# Patient Record
Sex: Female | Born: 1965 | Race: Black or African American | Hispanic: No | Marital: Single | Smoking: Current every day smoker
Health system: Southern US, Community
[De-identification: ages and names within clinical notes are randomized; demographics above are authoritative.]

## PROBLEM LIST (undated history)

## (undated) DIAGNOSIS — D649 Anemia, unspecified: Secondary | ICD-10-CM

## (undated) DIAGNOSIS — I10 Essential (primary) hypertension: Secondary | ICD-10-CM

## (undated) HISTORY — PX: TUBAL LIGATION: SHX77

---

## 2017-05-22 ENCOUNTER — Inpatient Hospital Stay: Admit: 2017-05-22 | Discharge: 2017-05-22 | Disposition: A | Discharge status: Home / Self Care

## 2017-05-22 ENCOUNTER — Emergency Department: Admit: 2017-05-22

## 2017-05-22 DIAGNOSIS — J40 Bronchitis, not specified as acute or chronic: Secondary | ICD-10-CM

## 2017-05-22 LAB — URINALYSIS
Bilirubin Urine: NEGATIVE
Glucose, Ur: NEGATIVE mg/dL
Ketones, Urine: NEGATIVE mg/dL
Leukocyte Esterase, Urine: NEGATIVE
Nitrite, Urine: NEGATIVE
Protein, UA: NEGATIVE mg/dL
Specific Gravity, UA: 1.025 (ref 1.005–1.030)
Urobilinogen, Urine: 0.2 E.U./dL (ref <2.0)
pH, UA: 5.5 (ref 5.0–9.0)

## 2017-05-22 LAB — RAPID INFLUENZA A/B ANTIGENS
Influenza A by PCR: NOT DETECTED
Influenza B by PCR: NOT DETECTED

## 2017-05-22 LAB — MICROSCOPIC URINALYSIS

## 2017-05-22 MED ORDER — IBUPROFEN 800 MG PO TABS
800 MG | Freq: Once | ORAL | Status: AC
Start: 2017-05-22 — End: 2017-05-22
  Administered 2017-05-22: 15:00:00 800 mg via ORAL

## 2017-05-22 MED ORDER — AZITHROMYCIN 250 MG PO TABS
250 MG | PACK | ORAL | 0 refills | Status: AC
Start: 2017-05-22 — End: 2017-06-01

## 2017-05-22 MED ORDER — GUAIFENESIN 100 MG/5ML PO LIQD
100 MG/5ML | Freq: Three times a day (TID) | ORAL | 0 refills | Status: AC | PRN
Start: 2017-05-22 — End: ?

## 2017-05-22 MED ORDER — IBUPROFEN 800 MG PO TABS
800 MG | ORAL_TABLET | Freq: Four times a day (QID) | ORAL | 0 refills | Status: AC | PRN
Start: 2017-05-22 — End: 2017-05-26

## 2017-05-22 MED ORDER — METHYLPREDNISOLONE 4 MG PO TBPK
4 MG | PACK | ORAL | 0 refills | Status: AC
Start: 2017-05-22 — End: 2017-05-28

## 2017-05-22 NOTE — ED Provider Notes (Signed)
[facility]    Department of Emergency Medicine   ED  Provider Note  Admit Date/RoomTime: 05/22/2017 11:13 AM  ED Room: 34/34  Chief Complaint:   Generalized Body Aches (for a week body aches, chills, cough)    History of Present Illness   Source of history provided by:  patient.  History/Exam Limitations: none.      Patricia Cordova is a 51 y.o. old female with a past medical history of: History reviewed. No pertinent past medical history. presents to the emergency department by private vehicle, for nasal congestion, rhinorrhea, sore throat and cough, which began several day(s) prior to arrival.  Since onset the symptoms have been persistent and mild-moderate in severity. The symptoms are associated with fatigue and generalized body aches.  There has been NO abdominal pain, appetite decrease, chest pain, diarrhea, dizziness or dysuria.She states that she is coughing yellow phlegm up     ROS   Pertinent positives and negatives are stated within HPI, all other systems reviewed and are negative.    Past Surgical History:  has a past surgical history that includes Tubal ligation.  Social History:  reports that she has been smoking Cigarettes.  She has been smoking about 0.50 packs per day. She has never used smokeless tobacco. She reports that she drinks alcohol. She reports that she uses drugs, including Marijuana.  Family History: family history is not on file.   Allergies: Pcn [penicillins] and Oxycodone    Physical Exam           ED Triage Vitals [05/22/17 1109]   BP Temp Temp Source Pulse Resp SpO2 Height Weight   (!) 190/124 98.5 F (36.9 C) Temporal 79 16 96 % 5' 5"  (1.651 m) 165 lb (74.8 kg)      Oxygen Saturation Interpretation: Normal.     Constitutional:  Alert, development consistent with age.   Ears:  External Ears: Bilateral normal.               TM's & External Canals: normal appearance.   Nose:   There is clear rhinorrhea, mucosal erythema and mucosal edema.   Sinuses: moderate Bilateral  maxillary sinus tenderness.                    mild Bilateral frontal sinus tenderness.   Mouth:  normal tongue and buccal mucosa.    Throat: mild erythema.  Airway Patent.   Neck:  Supple. There is no  preauricular, submental, parotid and anterior cervical node tenderness.   Respiratory:   Breath sounds: Bilateral normal.  Lung sounds: wheezing- scattered and intermittent.    CV:  Regular rate and rhythm, normal heart sounds, without pathological murmurs, ectopy, gallops, or rubs.   GI:  Abdomen Soft, nontender, good bowel sounds.  No firm or pulsatile mass.   Integument:  Normal turgor.  Warm, dry, without visible rash.   Neurological:  Oriented.  Motor functions intact.       Lab / Imaging Results   (All laboratory and radiology results have been personally reviewed by myself)  Labs:  Results for orders placed or performed during the hospital encounter of 05/22/17   Rapid influenza A/B antigens   Result Value Ref Range    Influenza A by PCR Not Detected Not Detected    Influenza B by PCR Not Detected Not Detected   Urinalysis   Result Value Ref Range    Color, UA Yellow Straw/Yellow    Clarity, UA Clear Clear  Glucose, Ur Negative Negative mg/dL    Bilirubin Urine Negative Negative    Ketones, Urine Negative Negative mg/dL    Specific Gravity, UA 1.025 1.005 - 1.030    Blood, Urine TRACE (A) Negative    pH, UA 5.5 5.0 - 9.0    Protein, UA Negative Negative mg/dL    Urobilinogen, Urine 0.2 <2.0 E.U./dL    Nitrite, Urine Negative Negative    Leukocyte Esterase, Urine Negative Negative   Microscopic Urinalysis   Result Value Ref Range    WBC, UA 0-1 0 - 5 /HPF    RBC, UA 0-1 0 - 2 /HPF    Epi Cells FEW /HPF    Bacteria, UA FEW (A) /HPF       Imaging:  All Radiology results interpreted by Radiologist unless otherwise noted.  XR CHEST STANDARD (2 VW)   Final Result   No airspace opacities or pleural effusion.                 ED Course / Medical Decision Making     Medications   ibuprofen (ADVIL;MOTRIN)  tablet 800 mg (800 mg Oral Given 05/22/17 1120)        Re-examination:  05/24/17       Time:1220   Patient's symptoms are improving.    Consults:   None    Procedures:   none    Medical Decision Making:    Based on low suspicion for pneumonia as per history/physical findings, imaging was done and confirms the above findings.  Upper respiratory infection may  be viral in etiology . Antibiotics are indicated at this time based on clinical presentation and physical findings. She is not hypoxic.  Patient is well appearing, non toxic and appropriate for outpatient management.   Plan of Care: Normal progression of disease discussed.  All questions answered.  Explained the rationale for symptomatic treatment rather than use of an antibiotic.  Instruction provided in the use of fluids, vaporizer, acetaminophen, and other OTC medication for symptom control.  Extra fluids  Analgesics as needed, dose reviewed.  Follow up as needed should symptoms fail to improve.     Counseling:    The emergency provider has spoken with the patient and discussed today's results, in addition to providing specific details for the plan of care and counseling regarding the diagnosis and prognosis.  Questions are answered at this time and they are agreeable with the plan.    Assessment     1. Bronchitis      Plan   Discharge to home  Patient condition is good    New Medications     Discharge Medication List as of 05/22/2017 12:24 PM      START taking these medications    Details   azithromycin (ZITHROMAX Z-PAK) 250 MG tablet TAKE 500MG PO DAY ONE... 250MG PO DAY TWO THROUGH FIVE   DISPENSE 6 TABS  NO REFILLS, Disp-1 packet, R-0Print      ibuprofen (ADVIL;MOTRIN) 800 MG tablet Take 1 tablet by mouth every 6 hours as needed for Pain, Disp-16 tablet, R-0Print      methylPREDNISolone (MEDROL, PAK,) 4 MG tablet Take by mouth., Disp-1 kit, R-0Print      guaiFENesin (ROBITUSSIN) 100 MG/5ML liquid Take 10 mLs by mouth 3 times daily as needed for Cough or  Congestion, Disp-118 mL, R-0Print           Electronically signed by Bing Matter, APRN - CNP   DD: 05/24/17  **This report  was transcribed using voice recognition software. Every effort was made to ensure accuracy; however, inadvertent computerized transcription errors may be present.  END OF ED PROVIDER NOTE          Bing Matter, APRN - CNP  05/24/17 [phone]

## 2018-08-27 ENCOUNTER — Emergency Department
Admission: EM | Admit: 2018-08-27 | Discharge: 2018-08-27 | Disposition: A | Discharge status: Home / Self Care | Payer: Self-pay | Attending: Emergency Medicine | Admitting: Emergency Medicine

## 2018-08-27 DIAGNOSIS — K529 Noninfective gastroenteritis and colitis, unspecified: Secondary | ICD-10-CM | POA: Insufficient documentation

## 2018-08-27 LAB — COMPREHENSIVE METABOLIC PANEL
ALT: 17 U/L (ref 0–44)
AST: 20 U/L (ref 15–41)
Albumin: 4.6 g/dL (ref 3.5–5.0)
Alkaline Phosphatase: 95 U/L (ref 38–126)
Anion gap: 10 (ref 5–15)
BUN: 18 mg/dL (ref 6–20)
CO2: 22 mmol/L (ref 22–32)
Calcium: 9.4 mg/dL (ref 8.9–10.3)
Chloride: 106 mmol/L (ref 98–111)
Creatinine, Ser: 0.84 mg/dL (ref 0.44–1.00)
GFR calc Af Amer: >60 mL/min (ref >60)
GFR calc non Af Amer: >60 mL/min (ref >60)
Glucose, Bld: 106 mg/dL — ABNORMAL HIGH (ref 70–99)
Potassium: 3.6 mmol/L (ref 3.5–5.1)
Sodium: 138 mmol/L (ref 135–145)
Total Bilirubin: 1.5 mg/dL — ABNORMAL HIGH (ref 0.3–1.2)
Total Protein: 7.9 g/dL (ref 6.5–8.1)

## 2018-08-27 LAB — CBC
HCT: 44.6 % (ref 36.0–46.0)
Hemoglobin: 14.9 g/dL (ref 12.0–15.0)
MCH: 28.9 pg (ref 26.0–34.0)
MCHC: 33.4 g/dL (ref 30.0–36.0)
MCV: 86.6 fL (ref 80.0–100.0)
Platelets: 269 10*3/uL (ref 150–400)
RBC: 5.15 MIL/uL — ABNORMAL HIGH (ref 3.87–5.11)
RDW: 13.9 % (ref 11.5–15.5)
WBC: 8.8 10*3/uL (ref 4.0–10.5)
nRBC: 0 % (ref 0.0–0.2)

## 2018-08-27 LAB — LIPASE, BLOOD: Lipase: 24 U/L (ref 11–51)

## 2018-08-27 MED ORDER — ONDANSETRON 4 MG PO TBDP: 4.0000 mg | ORAL_TABLET | Freq: Three times a day (TID) | ORAL | 0 refills | Status: DC | PRN

## 2018-08-27 NOTE — ED Provider Notes (Signed)
[facility] Emergency Department Provider Note   ____________________________________________    I have reviewed the triage vital signs and the nursing notes.   HISTORY  Chief Complaint Emesis and Diarrhea     HPI Hailey Myers is a 53 y.o. female who presents with complaints of nausea vomiting and diarrhea over the last 2 days.  She reports today symptoms have started to improve.  She notes that yesterday she had significant abdominal cramping but that is better today.  She has been able to tolerate broth and crackers.  Thinks she may have had a fever but is not sure.  Did have to miss work x2 days.  Believes that she may have a virus but wanted to "make sure ".   History reviewed. No pertinent past medical history.  There are no active problems to display for this patient.   Past Surgical History:  Procedure Laterality Date  . TUBAL LIGATION      Prior to Admission medications   Medication Sig Start Date End Date Taking? Authorizing Provider  ondansetron (ZOFRAN ODT) 4 MG disintegrating tablet Take 1 tablet (4 mg total) by mouth every 8 (eight) hours as needed for nausea or vomiting. 08/27/18   Jene Every, MD     Allergies Oxycodone; Paxil [paroxetine hcl]; Penicillins; and Strawberry (diagnostic)  History reviewed. No pertinent family history.  Social History Social History   Tobacco Use  . Smoking status: Never Smoker  Substance Use Topics  . Alcohol use: Not Currently  . Drug use: Not on file    Review of Systems  Constitutional: As above Eyes: No visual changes.  ENT: No sore throat. Cardiovascular: Denies chest pain. Respiratory: Denies shortness of breath.  No cough Gastrointestinal: As above Genitourinary: Negative for dysuria. Musculoskeletal: Negative for back pain.  No myalgias Skin: Negative for rash. Neurological: Negative for headaches    ____________________________________________   PHYSICAL  EXAM:  VITAL SIGNS: ED Triage Vitals [08/27/18 1130]  Enc Vitals Group     BP (!) 166/109     Pulse Rate (!) 101     Resp 18     Temp 98.6 F (37 C)     Temp Source Oral     SpO2 95 %     Weight 77.6 kg (171 lb)     Height 1.651 m (5\' 5" )     Head Circumference      Peak Flow      Pain Score 7     Pain Loc      Pain Edu?      Excl. in GC?     Constitutional: Alert and oriented. No acute distress. Pleasant and interactive Eyes: Conjunctivae are normal.  . Nose: No congestion/rhinnorhea. Mouth/Throat: Mucous membranes are moist.    Cardiovascular: Normal rate, regular rhythm. Grossly normal heart sounds.  Good peripheral circulation. Respiratory: Normal respiratory effort.  No retractions. Lungs CTAB. Gastrointestinal: Soft and nontender. No distention.  No CVA tenderness.  Reassuring exam  Musculoskeletal:  Warm and well perfused Neurologic:  Normal speech and language. No gross focal neurologic deficits are appreciated.  Skin:  Skin is warm, dry and intact. No rash noted. Psychiatric: Mood and affect are normal. Speech and behavior are normal.  ____________________________________________   LABS (all labs ordered are listed, but only abnormal results are displayed)  Labs Reviewed  COMPREHENSIVE METABOLIC PANEL - Abnormal; Notable for the following components:      Result Value   Glucose, Bld 106 (*)  Total Bilirubin 1.5 (*)    All other components within normal limits  CBC - Abnormal; Notable for the following components:   RBC 5.15 (*)    All other components within normal limits  LIPASE, BLOOD  URINALYSIS, COMPLETE (UACMP) WITH MICROSCOPIC   ____________________________________________  EKG  None ____________________________________________  RADIOLOGY   ____________________________________________   PROCEDURES  Procedure(s) performed: No  Procedures   Critical Care performed: No ____________________________________________   INITIAL  IMPRESSION / ASSESSMENT AND PLAN / ED COURSE  Pertinent labs & imaging results that were available during my care of the patient were reviewed by me and considered in my medical decision making (see chart for details).  Patient well-appearing in no acute distress.  Lab work is unremarkable.  Exam is benign.  Symptoms certainly sound consistent with viral gastroenteritis which appears to be improving.  Will treat with ODT Zofran as needed, appropriate for discharge at this time, return precautions discussed    ____________________________________________   FINAL CLINICAL IMPRESSION(S) / ED DIAGNOSES  Final diagnoses:  Gastroenteritis        Note:  This document was prepared using Dragon voice recognition software and may include unintentional dictation errors.   Jene EveryKinner, Deshane Cotroneo, MD 08/27/18 1235

## 2018-08-27 NOTE — ED Triage Notes (Signed)
Pt states "I think I have a virus" states vomiting and diarrhea x 2 days.   A&O, ambulatory. No distress noted. Mask in place.

## 2018-10-28 ENCOUNTER — Other Ambulatory Visit: Payer: Self-pay

## 2018-10-28 ENCOUNTER — Emergency Department: Payer: Self-pay

## 2018-10-28 ENCOUNTER — Emergency Department
Admission: EM | Admit: 2018-10-28 | Discharge: 2018-10-28 | Disposition: A | Discharge status: Home / Self Care | Payer: Self-pay | Attending: Emergency Medicine | Admitting: Emergency Medicine

## 2018-10-28 ENCOUNTER — Encounter: Payer: Self-pay | Admitting: Emergency Medicine

## 2018-10-28 DIAGNOSIS — I10 Essential (primary) hypertension: Secondary | ICD-10-CM | POA: Insufficient documentation

## 2018-10-28 DIAGNOSIS — Z711 Person with feared health complaint in whom no diagnosis is made: Secondary | ICD-10-CM | POA: Insufficient documentation

## 2018-10-28 DIAGNOSIS — F17200 Nicotine dependence, unspecified, uncomplicated: Secondary | ICD-10-CM | POA: Insufficient documentation

## 2018-10-28 HISTORY — DX: Anemia, unspecified: D64.9

## 2018-10-28 HISTORY — DX: Essential (primary) hypertension: I10

## 2018-10-28 LAB — CBC WITH DIFFERENTIAL/PLATELET
Abs Immature Granulocytes: 0.01 10*3/uL (ref 0.00–0.07)
BASOS ABS: 0.1 10*3/uL (ref 0.0–0.1)
Basophils Relative: 1 %
Eosinophils Absolute: 0.1 10*3/uL (ref 0.0–0.5)
Eosinophils Relative: 2 %
HCT: 43.5 % (ref 36.0–46.0)
Hemoglobin: 14.1 g/dL (ref 12.0–15.0)
IMMATURE GRANULOCYTES: 0 %
Lymphocytes Relative: 52 %
Lymphs Abs: 2.9 10*3/uL (ref 0.7–4.0)
MCH: 28.9 pg (ref 26.0–34.0)
MCHC: 32.4 g/dL (ref 30.0–36.0)
MCV: 89.1 fL (ref 80.0–100.0)
MONO ABS: 0.5 10*3/uL (ref 0.1–1.0)
Monocytes Relative: 8 %
Neutro Abs: 2.1 10*3/uL (ref 1.7–7.7)
Neutrophils Relative %: 37 %
Platelets: 203 10*3/uL (ref 150–400)
RBC: 4.88 MIL/uL (ref 3.87–5.11)
RDW: 14 % (ref 11.5–15.5)
WBC: 5.7 10*3/uL (ref 4.0–10.5)
nRBC: 0 % (ref 0.0–0.2)

## 2018-10-28 LAB — BASIC METABOLIC PANEL
ANION GAP: 7 (ref 5–15)
BUN: 20 mg/dL (ref 6–20)
CO2: 25 mmol/L (ref 22–32)
Calcium: 8.7 mg/dL — ABNORMAL LOW (ref 8.9–10.3)
Chloride: 107 mmol/L (ref 98–111)
Creatinine, Ser: 0.77 mg/dL (ref 0.44–1.00)
GFR calc Af Amer: >60 mL/min (ref >60)
GFR calc non Af Amer: >60 mL/min (ref >60)
Glucose, Bld: 108 mg/dL — ABNORMAL HIGH (ref 70–99)
Potassium: 3.7 mmol/L (ref 3.5–5.1)
Sodium: 139 mmol/L (ref 135–145)

## 2018-10-28 LAB — INFLUENZA PANEL BY PCR (TYPE A & B)
Influenza A By PCR: NEGATIVE
Influenza B By PCR: NEGATIVE

## 2018-10-28 LAB — TROPONIN I

## 2018-10-28 MED ORDER — ACETAMINOPHEN 325 MG PO TABS
650.0000 mg | ORAL_TABLET | Freq: Once | ORAL | Status: AC
  Administered 2018-10-28: 650 mg via ORAL
  Filled 2018-10-28: qty 2

## 2018-10-28 MED ORDER — AMLODIPINE BESYLATE 5 MG PO TABS
5.0000 mg | ORAL_TABLET | Freq: Once | ORAL | Status: AC
  Administered 2018-10-28: 5 mg via ORAL
  Filled 2018-10-28: qty 1

## 2018-10-28 MED ORDER — AMLODIPINE BESYLATE 5 MG PO TABS: 5.0000 mg | ORAL_TABLET | Freq: Every day | ORAL | 1 refills | Status: DC

## 2018-10-28 NOTE — Discharge Instructions (Signed)
Your blood pressure is elevated in the emergency department and you are at risk for a stroke or a heart attack.  Please follow-up with primary care for further blood pressure management.

## 2018-10-28 NOTE — ED Triage Notes (Signed)
Pt presents to ED via POV with c/o HA. Pt states is currently in menopause and has intermittent hot flashes. Pt states generalized body aches x several days, HA that started approx 1 week ago. Pt also c/o "a little nausea today". Pt states "I just have a little grandbabies at home and I just don't want to get them sick". Pt denies any recent travel. Pt states hx of HTN, states blood pressure is normally.

## 2018-10-28 NOTE — ED Provider Notes (Signed)
[facility] Emergency Department Provider Note  ____________________________________________  Time seen: Approximately 7:21 PM  I have reviewed the triage vital signs and the nursing notes.   HISTORY  Chief Complaint Headache    HPI Hailey Myers is a 53 y.o. female that presents to the emergency department for evaluation of headache and productive cough with phlegm for 1 week.  Patient states that she is worried about coronavirus and came to the emergency department because she has grandbaby is at home and does not want them to get sick.  Patient states that she had sweats several days ago at work, which is normal for her because she is going through menopause.  She had some nausea today.  She has also had some bilateral shoulder pain and right arm pain for the last week. Her stool was soft today.  Patient has a history of hypertension and was prescribed blood pressure medication in [place] previously but never followed up and self discontinued medication.  No sick contacts.  No recent travel. No contacts with Coronavirus. No SOB, vomiting, abdominal pain.   Past Medical History:  Diagnosis Date  . Anemia   . Hypertension     There are no active problems to display for this patient.   Past Surgical History:  Procedure Laterality Date  . TUBAL LIGATION      Prior to Admission medications   Medication Sig Start Date End Date Taking? Authorizing Provider  amLODipine (NORVASC) 5 MG tablet Take 1 tablet (5 mg total) by mouth daily. 10/28/18 10/28/19  Enid Derry, PA-C  ondansetron (ZOFRAN ODT) 4 MG disintegrating tablet Take 1 tablet (4 mg total) by mouth every 8 (eight) hours as needed for nausea or vomiting. 08/27/18   Jene Every, MD    Allergies Oxycodone; Paxil [paroxetine hcl]; Penicillins; and Strawberry (diagnostic)  History reviewed. No pertinent family history.  Social History Social History   Tobacco Use  . Smoking status: Current  Every Day Smoker  . Smokeless tobacco: Never Used  Substance Use Topics  . Alcohol use: Yes  . Drug use: Not on file     Review of Systems  Constitutional: No fever/chills ENT: No upper respiratory complaints. Cardiovascular: No chest pain. Respiratory: Positive for cough. No SOB. Gastrointestinal: No abdominal pain.  Positive for nausea. No vomiting.  Musculoskeletal: See HPI Skin: Negative for rash, abrasions, lacerations, ecchymosis. Neurological: Negative for numbness or tingling. Positive for frontal headache.   ____________________________________________   PHYSICAL EXAM:  VITAL SIGNS: ED Triage Vitals  Enc Vitals Group     BP 10/28/18 1730 (!) 195/123     Pulse Rate 10/28/18 1730 84     Resp 10/28/18 1730 18     Temp 10/28/18 1730 98.6 F (37 C)     Temp Source 10/28/18 1730 Oral     SpO2 10/28/18 1730 99 %     Weight 10/28/18 1731 157 lb (71.2 kg)     Height 10/28/18 1731 5\' 5"  (1.651 m)     Head Circumference --      Peak Flow --      Pain Score 10/28/18 1730 8     Pain Loc --      Pain Edu? --      Excl. in GC? --      Constitutional: Alert and oriented. Well appearing and in no acute distress. Eyes: Conjunctivae are normal. PERRL. EOMI. Head: Atraumatic. ENT:      Ears:      Nose: No congestion/rhinnorhea.  Mouth/Throat: Mucous membranes are moist.  Neck: No stridor.  Cardiovascular: Normal rate, regular rhythm.  Good peripheral circulation. Respiratory: Normal respiratory effort without tachypnea or retractions. Lungs CTAB. Good air entry to the bases with no decreased or absent breath sounds. Gastrointestinal: Bowel sounds 4 quadrants. Soft and nontender to palpation. No guarding or rigidity. No palpable masses. No distention. Musculoskeletal: Full range of motion to all extremities. No gross deformities appreciated. Neurologic:  Normal speech and language. No gross focal neurologic deficits are appreciated.  Skin:  Skin is warm, dry and  intact. No rash noted. Psychiatric: Mood and affect are normal. Speech and behavior are normal. Patient exhibits appropriate insight and judgement.   ____________________________________________   LABS (all labs ordered are listed, but only abnormal results are displayed)  Labs Reviewed  BASIC METABOLIC PANEL - Abnormal; Notable for the following components:      Result Value   Glucose, Bld 108 (*)    Calcium 8.7 (*)    All other components within normal limits  CBC WITH DIFFERENTIAL/PLATELET  TROPONIN I  INFLUENZA PANEL BY PCR (TYPE A & B)   ____________________________________________  EKG   ____________________________________________  RADIOLOGY Lexine Baton, personally viewed and evaluated these images (plain radiographs) as part of my medical decision making, as well as reviewing the written report by the radiologist.  Dg Chest 2 View  Result Date: 10/28/2018 CLINICAL DATA:  Pt presents to ED via POV with c/o HA. Pt states is currently in menopause and has intermittent hot flashes. Pt states generalized body aches x several days, HA that started approx 1 week ago. Pt also c/o "a little nausea today". Pt states "I just have a little grandbabies at home and I just don't want to get them sick". Pt denies any recent travel. Pt states hx of HTN, states blood pressure is normally. EXAM: CHEST - 2 VIEW COMPARISON:  None. FINDINGS: The cardiac silhouette is normal in size and configuration. Normal mediastinal and hilar contours. Linear opacities are noted at the lung bases consistent with scarring or subsegmental atelectasis. Lungs are otherwise clear. No pleural effusion or pneumothorax. Skeletal structures are unremarkable. IMPRESSION: No active cardiopulmonary disease. Electronically Signed   By: Amie Portland M.D.   On: 10/28/2018 19:06   Ct Head Wo Contrast  Result Date: 10/28/2018 CLINICAL DATA:  Headache EXAM: CT HEAD WITHOUT CONTRAST TECHNIQUE: Contiguous axial images were  obtained from the base of the skull through the vertex without intravenous contrast. COMPARISON:  None. FINDINGS: Brain: No acute intracranial abnormality. Specifically, no hemorrhage, hydrocephalus, mass lesion, acute infarction, or significant intracranial injury. Vascular: No hyperdense vessel or unexpected calcification. Skull: No acute calvarial abnormality. Sinuses/Orbits: Visualized paranasal sinuses and mastoids clear. Orbital soft tissues unremarkable. Old right medial orbital wall blowout fracture. Other: None IMPRESSION: No acute intracranial abnormality. Electronically Signed   By: Charlett Nose M.D.   On: 10/28/2018 19:42    ____________________________________________    PROCEDURES  Procedure(s) performed:    Procedures    Medications  acetaminophen (TYLENOL) tablet 650 mg (650 mg Oral Given 10/28/18 1943)  amLODipine (NORVASC) tablet 5 mg (5 mg Oral Given 10/28/18 2044)     ____________________________________________   INITIAL IMPRESSION / ASSESSMENT AND PLAN / ED COURSE  Pertinent labs & imaging results that were available during my care of the patient were reviewed by me and considered in my medical decision making (see chart for details).  Review of the [place] CSRS was performed in accordance of the [facility] prior to  dispensing any controlled drugs.   Patient presented to the emergency department with concerns of coronavirus.  Influenza test is negative.  Patient does not have any sick contacts with coronavirus and has not traveled out of the country recently.  She does not meet the current guidelines for coronavirus testing.  Patient's blood pressure was elevated when she arrived in the emergency department at 195/123.  Patient has a history of hypertension and states that it is usually this elevated.  She was on blood pressure medication previously but self discontinued it when she had a change of insurance.  Patient has had a headache on and off for the last week that  resolved while in the emergency department with Tylenol.  Head CT negative for acute abnormalities.  Chest x-ray is negative for acute cardiopulmonary processes.  EKG shows normal sinus rhythm.  Lab work is largely unremarkable. Troponin is <.03. Patient is reassured and feels much better knowing that she does not present with a history concerning for coronavirus.  She says that this has been bothering her all week and likely contributing to her elevated blood pressure.  She is educated extensively on her blood pressure and about her risk of heart attack or stroke.  She will be given a prescription for amlodipine and will call primary care tomorrow for further evaluation and work-up.  Patient will be discharged home with prescriptions for amlodipine. Patient is to follow up with PCP as directed. Patient is given ED precautions to return to the ED for any worsening or new symptoms.     ____________________________________________  FINAL CLINICAL IMPRESSION(S) / ED DIAGNOSES  Final diagnoses:  Hypertension, unspecified type  Feared complaint without diagnosis      NEW MEDICATIONS STARTED DURING THIS VISIT:  ED Discharge Orders         Ordered    amLODipine (NORVASC) 5 MG tablet  Daily     10/28/18 2128              This chart was dictated using voice recognition software/Dragon. Despite best efforts to proofread, errors can occur which can change the meaning. Any change was purely unintentional.    Enid Derry, PA-C 10/28/18 2345    Arnaldo Natal, MD 10/29/18 [phone]

## 2018-11-17 ENCOUNTER — Emergency Department
Admission: EM | Admit: 2018-11-17 | Discharge: 2018-11-17 | Disposition: A | Discharge status: Home / Self Care | Payer: Self-pay | Attending: Emergency Medicine | Admitting: Emergency Medicine

## 2018-11-17 ENCOUNTER — Encounter: Payer: Self-pay | Admitting: Emergency Medicine

## 2018-11-17 ENCOUNTER — Other Ambulatory Visit: Payer: Self-pay

## 2018-11-17 DIAGNOSIS — I10 Essential (primary) hypertension: Secondary | ICD-10-CM | POA: Insufficient documentation

## 2018-11-17 DIAGNOSIS — K029 Dental caries, unspecified: Secondary | ICD-10-CM | POA: Insufficient documentation

## 2018-11-17 DIAGNOSIS — K047 Periapical abscess without sinus: Secondary | ICD-10-CM | POA: Insufficient documentation

## 2018-11-17 DIAGNOSIS — F17210 Nicotine dependence, cigarettes, uncomplicated: Secondary | ICD-10-CM | POA: Insufficient documentation

## 2018-11-17 DIAGNOSIS — Z79899 Other long term (current) drug therapy: Secondary | ICD-10-CM | POA: Insufficient documentation

## 2018-11-17 MED ORDER — CLINDAMYCIN HCL 150 MG PO CAPS: ORAL_CAPSULE | ORAL | 0 refills | Status: DC

## 2018-11-17 MED ORDER — IBUPROFEN 600 MG PO TABS: 600.0000 mg | ORAL_TABLET | Freq: Three times a day (TID) | ORAL | 0 refills | Status: DC | PRN

## 2018-11-17 NOTE — Discharge Instructions (Addendum)
Take medication as prescribed.  Call 1 of the dental clinics listed on your discharge papers as they are charging on a sliding scale based on your income.  OPTIONS FOR DENTAL FOLLOW UP CARE  [place] [facility] and [facility] Services - Local Safety Net Dental Clinics [email or web address]   [facility] [phone])  [facility] [phone])  [place] [phone])  [facility] [phone])  [facility] [phone]) This clinic caters to the indigent population and is on a lottery system. Location: [employer] [facility], [employer], [address] [place] Clinic Hours: Wednesdays from 6pm - 9pm, patients seen by a lottery system. For dates, call or go to [email or web address] Services: Cleanings, fillings and simple extractions. Payment Options: DENTAL WORK IS FREE OF CHARGE. Bring proof of income or support. Best way to get seen: Arrive at 5:15 pm - this is a lottery, NOT first come/first serve, so arriving earlier will not increase your chances of being seen.     [facility] [phone] Select option 1 for emergencies   Location: [facility], [place], [address] [place] Clinic Hours: No walk-ins accepted - call the day before to schedule an appointment. Check in times are 9:30 am and 1:30 pm. Services: Simple extractions, temporary fillings, pulpectomy/pulp debridement, uncomplicated abscess drainage. Payment Options: PAYMENT IS DUE AT THE TIME OF SERVICE.  Fee is usually $100-200, additional surgical procedures (e.g. abscess drainage) may be extra. Cash, checks, Visa/MasterCard accepted.  Can file Medicaid if patient is covered for dental - patient should call case worker to check. No discount for [facility] patients. Best way to get seen: MUST call the  day before and get onto the schedule. Can usually be seen the next 1-2 days. No walk-ins accepted.     [facility] [phone]   Location: [facility], [address] [place] Clinic Hours: M, W, Th, F 8am or 1:30pm, Tues 9a or 1:30 - first come/first served. Services: Simple extractions, temporary fillings, uncomplicated abscess drainage.  You do not need to be an [facility] resident. Payment Options: PAYMENT IS DUE AT THE TIME OF SERVICE. Dental insurance, otherwise sliding scale - bring proof of income or support. Depending on income and treatment needed, cost is usually $50-200. Best way to get seen: Arrive early as it is first come/first served.     [facility] [phone]   Location: [address] Clinic Hours: Mon-Thu 8a-5p Services: Most basic dental services including extractions and fillings. Payment Options: PAYMENT IS DUE AT THE TIME OF SERVICE. Sliding scale, up to 50% off - bring proof if income or support. Medicaid with dental option accepted. Best way to get seen: Call to schedule an appointment, can usually be seen within 2 weeks OR they will try to see walk-ins - show up at 8a or 2p (you may have to wait).     [facility] [phone] [place] RESIDENTS ONLY   Location: [facility], [address], [place], [place] [address] Clinic Hours: By appointment only. Monday - Thursday 8am-5pm, Friday 8am-12pm Services: Cleanings, fillings, extractions. Payment Options: PAYMENT IS DUE AT THE TIME OF SERVICE. Cash, Visa or MasterCard. Sliding scale - $30 minimum per service. Best way to get seen: Come in to office, complete packet and make an appointment - need proof of income or support monies for each household member and  proof of [employer] [place] residence. Usually takes about a month to get in.     [facility] [phone]   Location: [address] [facility] Clinic Hours: Walk-in Urgent Care Dental Services are offered Monday-Friday mornings only. The numbers of emergencies accepted daily is limited to the number of providers available. Maximum 15 - Mondays, Wednesdays & Thursdays Maximum 10 - Tuesdays & Fridays Services: You do not need to be a [facility] resident to be seen for a dental emergency. Emergencies are defined as pain, swelling, abnormal bleeding, or dental trauma. Walkins will receive x-rays if needed. NOTE: Dental cleaning is not an emergency. Payment Options: PAYMENT IS DUE AT THE TIME OF SERVICE. Minimum co-pay is $40.00 for uninsured patients. Minimum co-pay is $3.00 for Medicaid with dental coverage. Dental Insurance is accepted and must be presented at time of visit. Medicare does not cover dental. Forms of payment: Cash, credit card, checks. Best way to get seen: If not previously registered with the clinic, walk-in dental registration begins at 7:15 am and is on a first come/first serve basis. If previously registered with the clinic, call to make an appointment.     [facility] [phone] [place] RESIDENTS ONLY   Location: [address] [place], [place] Clinic Hours: Mon-Thu 10a-2p Services: Extractions only! Payment Options: FREE (donations accepted) - bring proof of income or support Best way to get seen: Call and schedule an appointment OR come at 8am on the 1st Monday of every month (except for holidays) when it is first come/first served.     [facility] [phone]   Location: [address] [place], [place] Clinic Hours: Friday mornings Services, Payment Options, Best way to get seen: Call for info

## 2018-11-17 NOTE — ED Triage Notes (Signed)
Pt presents to ED via POV at this time. Pt presents with c/o R sided dental pain. Pt states hx of dental carries, states "they all broke up and I grind my teeth". Pt states has been medicating at home with a medication called "Kanka" without relief. Pt hypertensive in triage, from past visit pt with hx of HTN.

## 2018-11-17 NOTE — ED Notes (Addendum)
See triage note  States she has cracked a tooth couple of weeks ago  States pain has increased with min swelling   States pain is into jaw and right ear

## 2018-11-17 NOTE — ED Provider Notes (Signed)
[facility] Emergency Department Provider Note  ____________________________________________   First MD Initiated Contact with Patient 11/17/18 [phone]     (approximate)  I have reviewed the triage vital signs and the nursing notes.   HISTORY  Chief Complaint Dental Pain   HPI Hailey Myers is a 53 y.o. female presents to the ED with complaint of dental pain.  Patient states that she cracked a tooth couple weeks ago and has continued to have increased pain with some swelling.  She denies any fever or chills.  Patient states that a lot have broken off due to grinding her teeth.  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  Currently she rates her pain as a 10/10.     Past Medical History:  Diagnosis Date  . Anemia   . Hypertension     There are no active problems to display for this patient.   Past Surgical History:  Procedure Laterality Date  . TUBAL LIGATION      Prior to Admission medications   Medication Sig Start Date End Date Taking? Authorizing Provider  amLODipine (NORVASC) 5 MG tablet Take 1 tablet (5 mg total) by mouth daily. 10/28/18 10/28/19  Enid Derry, PA-C  clindamycin (CLEOCIN) 150 MG capsule 2 tabs tid x 7 days 11/17/18   Tommi Rumps, PA-C  ibuprofen (ADVIL,MOTRIN) 600 MG tablet Take 1 tablet (600 mg total) by mouth every 8 (eight) hours as needed. 11/17/18   Tommi Rumps, PA-C    Allergies Oxycodone; Paxil [paroxetine hcl]; Penicillins; and Strawberry (diagnostic)  No family history on file.  Social History Social History   Tobacco Use  . Smoking status: Current Every Day Smoker  . Smokeless tobacco: Never Used  Substance Use Topics  . Alcohol use: Yes  . Drug use: Yes    Types: Marijuana    Comment: Occ    Review of Systems Constitutional: No fever/chills Eyes: No visual changes. ENT: Positive dental pain. Cardiovascular: Denies chest pain. Respiratory: Denies shortness of breath. Skin:  Negative for rash. Neurological: Negative for headaches, focal weakness or numbness. ____________________________________________   PHYSICAL EXAM:  VITAL SIGNS: ED Triage Vitals [11/17/18 0937]  Enc Vitals Group     BP (!) 164/102     Pulse Rate 86     Resp 18     Temp 98.2 F (36.8 C)     Temp Source Oral     SpO2 97 %     Weight 157 lb (71.2 kg)     Height 5\' 5"  (1.651 m)     Head Circumference      Peak Flow      Pain Score 10     Pain Loc      Pain Edu?      Excl. in GC?     Constitutional: Alert and oriented. Well appearing and in no acute distress. Eyes: Conjunctivae are normal.  Head: Atraumatic. Nose: No congestion/rhinnorhea. Mouth/Throat: Mucous membranes are moist.  Oropharynx non-erythematous.  Numerous large caries noted.  Gums are inflamed with right posterior molars/gums tender.  No drainage present. Neck: No stridor.   Hematological/Lymphatic/Immunilogical: No cervical lymphadenopathy. Cardiovascular: Normal rate, regular rhythm. Grossly normal heart sounds.  Good peripheral circulation. Respiratory: Normal respiratory effort.  No retractions. Lungs CTAB. Neurologic:  Normal speech and language. No gross focal neurologic deficits are appreciated. No gait instability. Skin:  Skin is warm, dry and intact. No rash noted. Psychiatric: Mood and affect are normal. Speech and behavior are  normal.  ____________________________________________   LABS (all labs ordered are listed, but only abnormal results are displayed)  Labs Reviewed - No data to display   PROCEDURES  Procedure(s) performed (including Critical Care):  Procedures   ____________________________________________   INITIAL IMPRESSION / ASSESSMENT AND PLAN / ED COURSE  As part of my medical decision making, I reviewed the following data within the electronic MEDICAL RECORD NUMBER Notes from prior ED visits and [place] Controlled Substance Database  53 year old female presents to the ED with  complaint of dental pain and history of multiple dental caries.  Patient states that she broke a tooth while grinding her teeth.  She reports that she is a recovering addict.  No obvious abscess was noted however there was tenderness.  Patient was placed on clindamycin 3 times daily for 7 days along with ibuprofen.  She was given a list of dental clinics in the area and encouraged to follow-up with 1.  ____________________________________________   FINAL CLINICAL IMPRESSION(S) / ED DIAGNOSES  Final diagnoses:  Dental abscess  Dental caries     ED Discharge Orders         Ordered    clindamycin (CLEOCIN) 150 MG capsule     11/17/18 1011    ibuprofen (ADVIL,MOTRIN) 600 MG tablet  Every 8 hours PRN,   Status:  Discontinued     11/17/18 1011    ibuprofen (ADVIL,MOTRIN) 600 MG tablet  Every 8 hours PRN     11/17/18 1011           Note:  This document was prepared using Dragon voice recognition software and may include unintentional dictation errors.    Tommi Rumps, PA-C 11/17/18 1343    Emily Filbert, MD 11/17/18 [phone]

## 2018-12-24 ENCOUNTER — Other Ambulatory Visit: Payer: Self-pay

## 2018-12-24 ENCOUNTER — Emergency Department
Admission: EM | Admit: 2018-12-24 | Discharge: 2018-12-24 | Disposition: A | Discharge status: Home / Self Care | Payer: HRSA Program | Attending: Emergency Medicine | Admitting: Emergency Medicine

## 2018-12-24 DIAGNOSIS — F17200 Nicotine dependence, unspecified, uncomplicated: Secondary | ICD-10-CM | POA: Diagnosis not present

## 2018-12-24 DIAGNOSIS — Z029 Encounter for administrative examinations, unspecified: Secondary | ICD-10-CM | POA: Insufficient documentation

## 2018-12-24 DIAGNOSIS — R509 Fever, unspecified: Secondary | ICD-10-CM | POA: Insufficient documentation

## 2018-12-24 DIAGNOSIS — I10 Essential (primary) hypertension: Secondary | ICD-10-CM | POA: Diagnosis not present

## 2018-12-24 DIAGNOSIS — Z03818 Encounter for observation for suspected exposure to other biological agents ruled out: Secondary | ICD-10-CM | POA: Diagnosis not present

## 2018-12-24 MED ORDER — AMLODIPINE BESYLATE 5 MG PO TABS: 10.0000 mg | ORAL_TABLET | Freq: Every day | ORAL | 1 refills | Status: DC

## 2018-12-24 NOTE — Discharge Instructions (Signed)
Your blood pressure is still elevated in the emergency department.  You can increase your amlodipine to 10 mg/day.  Please establish with primary care.  Please follow-up with the emergency department at the end of the week if you have not heard back about your coronavirus results.

## 2018-12-24 NOTE — ED Provider Notes (Signed)
[facility] Emergency Department Provider Note  ____________________________________________  Time seen: Approximately 1:54 PM  I have reviewed the triage vital signs and the nursing notes.   HISTORY  Chief Complaint Letter for School/Work    HPI Hailey GeroldGlenda Myers is a 53 y.o. female that presents to the emergency department for evaluation of fever of 101 yesterday and 2 episodes of diarrhea.  Patient states that she ate a lot of food on Monday for her small Mother's Day gathering.  Yesterday she had 2 episodes of diarrhea and checked her temperature, which was 101.  She felt fine the rest of the night.  She never had any vomiting or abdominal pain.  She attributed the fever to a mild case of food poisoning.  She denies any shortness of breath or cough.  No contacts with COVID-19.  She tried to go to work this morning and her job told her to come to the emergency department for a note.  Patient states that she was given a prescription for blood pressure medication in the emergency department 2 months ago.  She has been taking medication as prescribed.  She has been unable to follow-up with primary care.  Her blood pressure stays elevated.  She took her blood pressure medication this morning.  No shortness of breath, chest pain.   Past Medical History:  Diagnosis Date  . Anemia   . Hypertension     There are no active problems to display for this patient.   Past Surgical History:  Procedure Laterality Date  . TUBAL LIGATION      Prior to Admission medications   Medication Sig Start Date End Date Taking? Authorizing Provider  amLODipine (NORVASC) 5 MG tablet Take 2 tablets (10 mg total) by mouth daily. 12/24/18 12/24/19  Enid DerryWagner, Maneh Sieben, PA-C    Allergies Oxycodone; Paxil [paroxetine hcl]; Penicillins; and Strawberry (diagnostic)  No family history on file.  Social History Social History   Tobacco Use  . Smoking status: Current Every Day Smoker  .  Smokeless tobacco: Never Used  Substance Use Topics  . Alcohol use: Yes  . Drug use: Yes    Types: Marijuana    Comment: Occ     Review of Systems  Constitutional: No fever/chills ENT: No upper respiratory complaints. Cardiovascular: No chest pain. Respiratory: No SOB.  No cough. Gastrointestinal: No abdominal pain.  No nausea, no vomiting.  Positive for 2 episodes of diarrhea. Musculoskeletal: Negative for musculoskeletal pain. Skin: Negative for rash, abrasions, lacerations, ecchymosis. Neurological: Negative for headaches, numbness or tingling   ____________________________________________   PHYSICAL EXAM:  VITAL SIGNS: ED Triage Vitals  Enc Vitals Group     BP 12/24/18 1330 (!) 184/125     Pulse Rate 12/24/18 1330 83     Resp 12/24/18 1330 18     Temp 12/24/18 1330 98.9 F (37.2 C)     Temp Source 12/24/18 1330 Oral     SpO2 12/24/18 1330 98 %     Weight 12/24/18 1331 165 lb (74.8 kg)     Height 12/24/18 1331 5\' 5"  (1.651 m)     Head Circumference --      Peak Flow --      Pain Score 12/24/18 1331 0     Pain Loc --      Pain Edu? --      Excl. in GC? --      Constitutional: Alert and oriented. Well appearing and in no acute distress. Eyes: Conjunctivae are normal. PERRL.  EOMI. Head: Atraumatic. ENT:      Ears:      Nose: No congestion/rhinnorhea.      Mouth/Throat: Mucous membranes are moist.  Neck: No stridor.   Cardiovascular: Normal rate, regular rhythm.  Good peripheral circulation. Respiratory: Normal respiratory effort without tachypnea or retractions. Lungs CTAB. Good air entry to the bases with no decreased or absent breath sounds. Gastrointestinal: Bowel sounds 4 quadrants. Soft and nontender to palpation. No guarding or rigidity. No palpable masses. No distention.  Musculoskeletal: Full range of motion to all extremities. No gross deformities appreciated. Neurologic:  Normal speech and language. No gross focal neurologic deficits are  appreciated.  Skin:  Skin is warm, dry and intact. No rash noted. Psychiatric: Mood and affect are normal. Speech and behavior are normal. Patient exhibits appropriate insight and judgement.   ____________________________________________   LABS (all labs ordered are listed, but only abnormal results are displayed)  Labs Reviewed  NOVEL CORONAVIRUS, NAA (HOSPITAL ORDER, SEND-OUT TO REF LAB)   ____________________________________________  EKG   ____________________________________________  RADIOLOGY   No results found.  ____________________________________________    PROCEDURES  Procedure(s) performed:    Procedures    Medications - No data to display   ____________________________________________   INITIAL IMPRESSION / ASSESSMENT AND PLAN / ED COURSE  Pertinent labs & imaging results that were available during my care of the patient were reviewed by me and considered in my medical decision making (see chart for details).  Review of the [place] CSRS was performed in accordance of the [facility] prior to dispensing any controlled drugs.     Patient presented to emergency department requesting a work note.  Vital signs and exam are reassuring.  Outpatient COVID test was ordered for patient's fever yesterday.  She will not return to work until results of test.  Patient's blood pressure is elevated again in the emergency department on arrival at 184/125.  She is currently taking 5 mg of amlodipine and will begin taking 10 mg of amlodipine.  She is asymptomatic.  She was highly encouraged once again to follow-up and establish with primary care.  Patient will be discharged home with prescriptions for amlodipine. Patient is to follow up with primary care as directed. Patient is given ED precautions to return to the ED for any worsening or new symptoms.     ____________________________________________  FINAL CLINICAL IMPRESSION(S) / ED DIAGNOSES  Final diagnoses:   Hypertension, unspecified type  Fever, unspecified fever cause      NEW MEDICATIONS STARTED DURING THIS VISIT:  ED Discharge Orders         Ordered    amLODipine (NORVASC) 5 MG tablet  Daily     12/24/18 1524              This chart was dictated using voice recognition software/Dragon. Despite best efforts to proofread, errors can occur which can change the meaning. Any change was purely unintentional.    Enid Derry, PA-C 12/24/18 1558    Minna Antis, MD 12/25/18 1515

## 2018-12-24 NOTE — ED Notes (Signed)
See triage note  Presents requesting a note to go back to work  States she ate something on Sunday  Then developed slight fever and some diarrhea  Afebrile on arrival   States last diarrhea stool was yesterday

## 2018-12-24 NOTE — ED Triage Notes (Signed)
Pt states she had an upset stomach yesterday and missed work states she was better today but her work will not let her return until she is seen/work note.

## 2018-12-26 ENCOUNTER — Telehealth: Payer: Self-pay | Admitting: Emergency Medicine

## 2018-12-26 LAB — NOVEL CORONAVIRUS, NAA (HOSP ORDER, SEND-OUT TO REF LAB; TAT 18-24 HRS): SARS-CoV-2, NAA: NOT DETECTED

## 2018-12-26 NOTE — Telephone Encounter (Signed)
Called patient and informed her of negative covid 19 test result.  Emailed copy to patient at [email or web address]

## 2019-10-03 ENCOUNTER — Emergency Department: Payer: Self-pay

## 2019-10-03 ENCOUNTER — Encounter: Payer: Self-pay | Admitting: Intensive Care

## 2019-10-03 ENCOUNTER — Other Ambulatory Visit: Payer: Self-pay

## 2019-10-03 ENCOUNTER — Emergency Department
Admission: EM | Admit: 2019-10-03 | Discharge: 2019-10-03 | Disposition: A | Discharge status: Home / Self Care | Payer: Self-pay | Attending: Emergency Medicine | Admitting: Emergency Medicine

## 2019-10-03 DIAGNOSIS — F17210 Nicotine dependence, cigarettes, uncomplicated: Secondary | ICD-10-CM | POA: Insufficient documentation

## 2019-10-03 DIAGNOSIS — I10 Essential (primary) hypertension: Secondary | ICD-10-CM | POA: Insufficient documentation

## 2019-10-03 DIAGNOSIS — Z20822 Contact with and (suspected) exposure to covid-19: Secondary | ICD-10-CM | POA: Insufficient documentation

## 2019-10-03 DIAGNOSIS — Z91199 Patient's noncompliance with other medical treatment and regimen due to unspecified reason: Secondary | ICD-10-CM

## 2019-10-03 DIAGNOSIS — B349 Viral infection, unspecified: Secondary | ICD-10-CM | POA: Insufficient documentation

## 2019-10-03 DIAGNOSIS — Z9119 Patient's noncompliance with other medical treatment and regimen: Secondary | ICD-10-CM

## 2019-10-03 DIAGNOSIS — Z9114 Patient's other noncompliance with medication regimen: Secondary | ICD-10-CM | POA: Insufficient documentation

## 2019-10-03 LAB — CBC WITH DIFFERENTIAL/PLATELET
Abs Immature Granulocytes: 0.02 K/uL (ref 0.00–0.07)
Basophils Absolute: 0.1 K/uL (ref 0.0–0.1)
Basophils Relative: 1 %
Eosinophils Absolute: 0.2 K/uL (ref 0.0–0.5)
Eosinophils Relative: 3 %
HCT: 39.6 % (ref 36.0–46.0)
Hemoglobin: 12.8 g/dL (ref 12.0–15.0)
Immature Granulocytes: 0 %
Lymphocytes Relative: 46 %
Lymphs Abs: 3.5 K/uL (ref 0.7–4.0)
MCH: 28.6 pg (ref 26.0–34.0)
MCHC: 32.3 g/dL (ref 30.0–36.0)
MCV: 88.4 fL (ref 80.0–100.0)
Monocytes Absolute: 0.4 K/uL (ref 0.1–1.0)
Monocytes Relative: 6 %
Neutro Abs: 3.3 K/uL (ref 1.7–7.7)
Neutrophils Relative %: 44 %
Platelets: 238 K/uL (ref 150–400)
RBC: 4.48 MIL/uL (ref 3.87–5.11)
RDW: 14.5 % (ref 11.5–15.5)
WBC: 7.6 K/uL (ref 4.0–10.5)
nRBC: 0 % (ref 0.0–0.2)

## 2019-10-03 LAB — COMPREHENSIVE METABOLIC PANEL
ALT: 32 U/L (ref 0–44)
AST: 30 U/L (ref 15–41)
Albumin: 3.6 g/dL (ref 3.5–5.0)
Alkaline Phosphatase: 77 U/L (ref 38–126)
Anion gap: 11 (ref 5–15)
BUN: 15 mg/dL (ref 6–20)
CO2: 26 mmol/L (ref 22–32)
Calcium: 9.1 mg/dL (ref 8.9–10.3)
Chloride: 104 mmol/L (ref 98–111)
Creatinine, Ser: 0.92 mg/dL (ref 0.44–1.00)
GFR calc Af Amer: >60 mL/min (ref >60)
GFR calc non Af Amer: >60 mL/min (ref >60)
Glucose, Bld: 115 mg/dL — ABNORMAL HIGH (ref 70–99)
Potassium: 3.7 mmol/L (ref 3.5–5.1)
Sodium: 141 mmol/L (ref 135–145)
Total Bilirubin: 0.4 mg/dL (ref 0.3–1.2)
Total Protein: 6.8 g/dL (ref 6.5–8.1)

## 2019-10-03 MED ORDER — AZITHROMYCIN 250 MG PO TABS: ORAL_TABLET | ORAL | 0 refills | Status: DC

## 2019-10-03 MED ORDER — AMLODIPINE BESYLATE 10 MG PO TABS: 10.0000 mg | ORAL_TABLET | Freq: Every day | ORAL | 1 refills | Status: DC

## 2019-10-03 MED ORDER — BENZONATATE 100 MG PO CAPS: 100.0000 mg | ORAL_CAPSULE | Freq: Three times a day (TID) | ORAL | 0 refills | Status: DC | PRN

## 2019-10-03 NOTE — Discharge Instructions (Addendum)
Follow-up with your primary care provider if any continued problems or concerns.  You also need to call and make an appointment for follow-up of your blood pressure.  Begin taking your blood pressure medication as soon as possible.  You need to take it every day.  Your blood pressure is extremely high even though you do not have symptoms.  You should also not be driving or operating machinery with the blood pressure that is uncontrolled.  Increase fluids and discontinue smoking.  The Z-Pak was sent to your pharmacy and Tessalon which will help with coughing.  You will get the results of your Covid test and in approximately 2 days at which time if it is negative you may return to work provided your blood pressure is not elevated such as it is today.  A list of clinics is listed on your discharge papers.  Call all of these offices to see if they are taking new patients.  Also the open-door clinic is available to you at no charge.  Take your blood pressure medication every day

## 2019-10-03 NOTE — ED Triage Notes (Signed)
Patient reports being out of work all week with flu like symptoms. 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 Patient reports recent covid test negative. Only c/o feeling "tired" now. Other symptoms resolved

## 2019-10-03 NOTE — ED Notes (Signed)
Pt states that she has been out of work for flu like symptoms. Tested negative for COVID. Pt was sent home from work Sunday, pt tried to go back to work but they told her she needs a note stating she can go back.

## 2019-10-03 NOTE — ED Provider Notes (Signed)
[facility] Emergency Department Provider Note  ____________________________________________   First MD Initiated Contact with Patient 10/03/19 1223     (approximate)  I have reviewed the triage vital signs and the nursing notes.   HISTORY  Chief Complaint Generalized Body Aches   HPI Hailey Myers is a 54 y.o. female presents to the ED with complaint of flulike symptoms for over a week.  Patient states that she was tested last Wednesday (10 days ago) for Covid at work and it was reported as negative.  She states that she routinely has to do this when she works for [employer].  Patient reports that she has had chills, cough, diarrhea and fatigue for the last 10 days.  She states she has been unable to smoke cigarettes due to her breathing.  Patient is unaware of any known exposure to Covid.  Patient also has hypertension but did not take her blood pressure medication today.  Later she admits that she has not taken her blood pressure medication in over a week and does not have any medication left.  She rates her pain as a 0/10.      Past Medical History:  Diagnosis Date  . Anemia   . Hypertension     There are no problems to display for this patient.   Past Surgical History:  Procedure Laterality Date  . TUBAL LIGATION      Prior to Admission medications   Medication Sig Start Date End Date Taking? Authorizing Provider  amLODipine (NORVASC) 10 MG tablet Take 1 tablet (10 mg total) by mouth daily. 10/03/19 10/02/20  Tommi Rumps, PA-C  azithromycin (ZITHROMAX Z-PAK) 250 MG tablet Take 2 tablets (500 mg) on  Day 1,  followed by 1 tablet (250 mg) once daily on Days 2 through 5. 10/03/19   Tommi Rumps, PA-C  benzonatate (TESSALON PERLES) 100 MG capsule Take 1 capsule (100 mg total) by mouth 3 (three) times daily as needed for cough. 10/03/19 10/02/20  Tommi Rumps, PA-C    Allergies Oxycodone, Paxil [paroxetine hcl], Penicillins, and Strawberry  (diagnostic)  History reviewed. No pertinent family history.  Social History Social History   Tobacco Use  . Smoking status: Current Every Day Smoker    Types: Cigarettes  . Smokeless tobacco: Never Used  Substance Use Topics  . Alcohol use: Yes  . Drug use: Yes    Types: Marijuana    Comment: Occ    Review of Systems Constitutional: No fever/positive chills.  Positive for fatigue. Eyes: No visual changes. ENT: No sore throat.  Positive nasal congestion. Cardiovascular: Denies chest pain. Respiratory: Denies shortness of breath.  Does not have cough. Gastrointestinal: No abdominal pain.  No nausea, no vomiting.  Positive diarrhea.  No constipation. Genitourinary: Negative for dysuria. Musculoskeletal: Positive for body aches. Skin: Negative for rash. Neurological: Negative for headaches, focal weakness or numbness.  ____________________________________________   PHYSICAL EXAM:  VITAL SIGNS: ED Triage Vitals  Enc Vitals Group     BP 10/03/19 1210 (!) 198/123     Pulse Rate 10/03/19 1210 75     Resp 10/03/19 1210 18     Temp 10/03/19 1210 98.5 F (36.9 C)     Temp Source 10/03/19 1210 Oral     SpO2 10/03/19 1210 98 %     Weight 10/03/19 1206 165 lb (74.8 kg)     Height 10/03/19 1206 5\' 5"  (1.651 m)     Head Circumference --  Peak Flow --      Pain Score 10/03/19 1206 0     Pain Loc --      Pain Edu? --      Excl. in GC? --     Constitutional: Alert and oriented. Well appearing and in no acute distress. Eyes: Conjunctivae are normal.  Head: Atraumatic. Nose: Positive congestion/rhinnorhea. Neck: No stridor.   Cardiovascular: Normal rate, regular rhythm. Grossly normal heart sounds.  Good peripheral circulation. Respiratory: Normal respiratory effort.  No retractions. Lungs CTAB. Gastrointestinal: Soft and nontender. No distention. Musculoskeletal: No lower extremity tenderness nor edema.  Patient is able to stand and ambulate without any  assistance. Neurologic:  Normal speech and language. No gross focal neurologic deficits are appreciated. No gait instability. Skin:  Skin is warm, dry and intact. No rash noted. Psychiatric: Mood and affect are normal. Speech and behavior are normal.  ____________________________________________   LABS (all labs ordered are listed, but only abnormal results are displayed)  Labs Reviewed  COMPREHENSIVE METABOLIC PANEL - Abnormal; Notable for the following components:      Result Value   Glucose, Bld 115 (*)    All other components within normal limits  SARS CORONAVIRUS 2 (TAT 6-24 HRS)  CBC WITH DIFFERENTIAL/PLATELET   __________________________________________  RADIOLOGY   Official radiology report(s): DG Chest Portable 1 View  Result Date: 10/03/2019 CLINICAL DATA:  Cough for 1 week. EXAM: PORTABLE CHEST 1 VIEW COMPARISON:  Chest x-ray 10/28/2018. FINDINGS: Mediastinum and hilar structures normal. Lungs are clear. No pleural effusion or pneumothorax. Heart size normal. No acute bony abnormality. IMPRESSION: No acute cardiopulmonary disease. Electronically Signed   By: Maisie Fus  Register   On: 10/03/2019 13:07    ____________________________________________   PROCEDURES  Procedure(s) performed (including Critical Care):  Procedures   ____________________________________________   INITIAL IMPRESSION / ASSESSMENT AND PLAN / ED COURSE  As part of my medical decision making, I reviewed the following data within the electronic MEDICAL RECORD NUMBER Notes from prior ED visits and [place] Controlled Substance Database  Hailey Myers was evaluated in Emergency Department on 10/03/2019 for the symptoms described in the history of present illness. She was evaluated in the context of the global COVID-19 pandemic, which necessitated consideration that the patient might be at risk for infection with the SARS-CoV-2 virus that causes COVID-19. Institutional protocols and algorithms that pertain to  the evaluation of patients at risk for COVID-19 are in a state of rapid change based on information released by regulatory bodies including the CDC and federal and state organizations. These policies and algorithms were followed during the patient's care in the ED.  54 year old female presents to the ED with complaint of flulike symptoms for approximately 10 days.  Patient states that she has had chills, cough, fatigue, and diarrhea.  Patient states that she had a Covid test done at work 10 days ago which was reported as negative.  She has continued to cough but denies any shortness of breath.  Blood pressure on arrival was 198/123.  At that time patient stated that she had not taken her medication for the day.  She denied any chest pain, dizziness, shortness of breath or headache.  Later patient states that she has not taken her blood pressure medication in the last week.  Chest x-ray was negative for acute changes.  Her physical exam was unremarkable with the exception of some congestion and noted cough.  Patient was given a prescription for Zithromax pack, Tessalon Perles and a prescription for Norvasc  10 mg 1 daily #30 with 1 refill.  She was given a list of clinics in this area along with [facility] to [facility] on her blood pressure.  A Covid test was done prior to discharge.  She is to remain out of work until she is received the results of her Covid test.  It was also recommended that her blood pressure be rechecked by someone at [facility] and for her to make an appointment for better control of her blood pressure.  It is also noted that apparently patient has been using the emergency department for her blood pressure medication.  The last time of prescription was written was 12/24/2018 so it is doubtful that patient has followed up with anyone for her hypertension.  She was made aware of the complications due to hypertension such as stroke and heart  attack.  ____________________________________________   FINAL CLINICAL IMPRESSION(S) / ED DIAGNOSES  Final diagnoses:  Viral illness  Poorly-controlled hypertension  Non compliance with medical treatment  Cigarette smoker     ED Discharge Orders         Ordered    azithromycin (ZITHROMAX Z-PAK) 250 MG tablet     10/03/19 1354    benzonatate (TESSALON PERLES) 100 MG capsule  3 times daily PRN     10/03/19 1354    amLODipine (NORVASC) 10 MG tablet  Daily     10/03/19 1413           Note:  This document was prepared using Dragon voice recognition software and may include unintentional dictation errors.    Johnn Hai, PA-C 10/03/19 1521    Delman Kitten, MD 10/03/19 [phone]

## 2019-10-04 LAB — SARS CORONAVIRUS 2 (TAT 6-24 HRS): SARS Coronavirus 2: NEGATIVE

## 2019-12-08 ENCOUNTER — Other Ambulatory Visit: Payer: Self-pay

## 2019-12-08 ENCOUNTER — Emergency Department
Admission: EM | Admit: 2019-12-08 | Discharge: 2019-12-08 | Disposition: A | Discharge status: Home / Self Care | Payer: BLUE CROSS/BLUE SHIELD | Attending: Emergency Medicine | Admitting: Emergency Medicine

## 2019-12-08 ENCOUNTER — Encounter: Payer: Self-pay | Admitting: Emergency Medicine

## 2019-12-08 DIAGNOSIS — I10 Essential (primary) hypertension: Secondary | ICD-10-CM | POA: Diagnosis not present

## 2019-12-08 DIAGNOSIS — F1210 Cannabis abuse, uncomplicated: Secondary | ICD-10-CM | POA: Diagnosis not present

## 2019-12-08 DIAGNOSIS — R221 Localized swelling, mass and lump, neck: Secondary | ICD-10-CM | POA: Diagnosis not present

## 2019-12-08 DIAGNOSIS — L209 Atopic dermatitis, unspecified: Secondary | ICD-10-CM | POA: Insufficient documentation

## 2019-12-08 DIAGNOSIS — F17210 Nicotine dependence, cigarettes, uncomplicated: Secondary | ICD-10-CM | POA: Diagnosis not present

## 2019-12-08 DIAGNOSIS — H7291 Unspecified perforation of tympanic membrane, right ear: Secondary | ICD-10-CM | POA: Insufficient documentation

## 2019-12-08 DIAGNOSIS — H9211 Otorrhea, right ear: Secondary | ICD-10-CM | POA: Diagnosis present

## 2019-12-08 DIAGNOSIS — Z79899 Other long term (current) drug therapy: Secondary | ICD-10-CM | POA: Insufficient documentation

## 2019-12-08 MED ORDER — OFLOXACIN 0.3 % OT SOLN: 5.0000 [drp] | Freq: Two times a day (BID) | OTIC | 0 refills | Status: AC

## 2019-12-08 MED ORDER — HYDROCORTISONE 2.5 % EX OINT: TOPICAL_OINTMENT | Freq: Two times a day (BID) | CUTANEOUS | 0 refills | Status: AC

## 2019-12-08 NOTE — ED Notes (Signed)
See triage note  Presents with right ear pain with some

## 2019-12-08 NOTE — ED Triage Notes (Signed)
Patient presents to the ED with multiple complaints.  Patient is complaining of drainage to her right ear and states her ear feels, "full".  Patient states ear problems started a few days ago.  Patient states today she noticed the right side of her neck seemed slightly swollen.  Patient denies any difficulty swallowing or breathing.  Patient speaking in clear sentences.  Patient reports not taking her bp medication this morning.  Patient also has a patch of dry skin to the left side of her upper back that started 1.5 weeks ago after a box hit her in the back at her workplace.

## 2019-12-08 NOTE — ED Provider Notes (Signed)
[facility] Emergency Department Provider Note ____________________________________________  Time seen: Approximately 5:17 PM  I have reviewed the triage vital signs and the nursing notes.   HISTORY  Chief Complaint Ear Problem and Skin Problem    HPI Hailey Myers is a 54 y.o. female presenting to the emergency department for treatment and evaluation of drainage to her right ear for the past couple of days.  She states that it feels "full."  She also states that she has had some swelling on the right side of her neck that she noticed today.  She denies URI symptoms.  She also has an area of itchy, dry skin to her left flank area after a box hit her in the back approximately a week and a half ago.  She is also hypertensive but states that she has not taken her blood pressure medicine today and states that her doctor has been increasing her dosages of medications to try and get her pressures under control.  She is asymptomatic regarding hypertension.   Past Medical History:  Diagnosis Date  . Anemia   . Hypertension     There are no problems to display for this patient.   Past Surgical History:  Procedure Laterality Date  . TUBAL LIGATION      Prior to Admission medications   Medication Sig Start Date End Date Taking? Authorizing Provider  amLODipine (NORVASC) 10 MG tablet Take 1 tablet (10 mg total) by mouth daily. 10/03/19 10/02/20  Tommi Rumps, PA-C  hydrocortisone 2.5 % ointment Apply topically 2 (two) times daily. 12/08/19   Kierstyn Baranowski, Rulon Eisenmenger B, FNP  ofloxacin (FLOXIN) 0.3 % OTIC solution Place 5 drops into the right ear 2 (two) times daily for 5 days. 12/08/19 12/13/19  Chinita Pester, FNP    Allergies Oxycodone, Paxil [paroxetine hcl], Penicillins, and Strawberry (diagnostic)  No family history on file.  Social History Social History   Tobacco Use  . Smoking status: Current Every Day Smoker    Types: Cigarettes  . Smokeless tobacco: Never  Used  Substance Use Topics  . Alcohol use: Yes  . Drug use: Yes    Types: Marijuana    Comment: Occ    Review of Systems Constitutional: Negative for fever.  Positive for decreased ability to hear from right ear(s). Eyes: Negative for discharge or drainage. ENT:       Positive for otalgia in right ear(s).      Negative for rhinorrhea or congestion.      Negative for sore throat. Gastrointestinal: Negative for nausea, vomiting, or diarrhea. Musculoskeletal: Negative for myalgias. Skin: Negative for rash, lesions, or wounds. Neurological: Negative for paresthesias. ____________________________________________   PHYSICAL EXAM:  VITAL SIGNS: ED Triage Vitals  Enc Vitals Group     BP 12/08/19 1302 (!) 179/113     Pulse Rate 12/08/19 1302 75     Resp 12/08/19 1302 18     Temp 12/08/19 1302 98.4 F (36.9 C)     Temp Source 12/08/19 1302 Oral     SpO2 12/08/19 1302 98 %     Weight 12/08/19 1303 155 lb (70.3 kg)     Height 12/08/19 1303 5\' 5"  (1.651 m)     Head Circumference --      Peak Flow --      Pain Score 12/08/19 1302 10     Pain Loc --      Pain Edu? --      Excl. in GC? --  Constitutional: Well appearing. Eyes: Conjunctivae are clear without discharge or drainage. Ears:       Right TM: Perforation noted central to the tympanic membrane.      Left TM: Normal. Head: Atraumatic. Nose: No rhinorrhea or sinus pain on percussion. Mouth/Throat: Oropharynx normal. Tonsils normal without exudate. Hematological/Lymphatic/Immunilogical: No palpable anterior cervical lymphadenopathy. Cardiovascular: Heart rate and rhythm are regular without murmur, gallop, or rub appreciated. Respiratory: Breath sounds are clear throughout to auscultation.  Neurologic:  Alert and oriented x 4. Skin: Small patch of skin on the left mid back that is scaly.  No erythema or indication of infection. ____________________________________________   LABS (all labs ordered are listed, but  only abnormal results are displayed)  Labs Reviewed - No data to display ____________________________________________   RADIOLOGY  Not indicated ____________________________________________   PROCEDURES  Procedure(s) performed:   Procedures  ____________________________________________   INITIAL IMPRESSION / ASSESSMENT AND PLAN / ED COURSE  54 year old female presenting to the emergency department for multiple medical complaints.  See HPI for further details.  Exam shows a right tympanic membrane perforation.  She is to use ofloxacin drops.  Her hearing is minimally impaired.  She is to follow-up with ENT to ensure healing.  Area of concern on her back appears more to be an atopic dermatitis and she will be given some hydrocortisone cream to use for the next week or so.  Patient was advised that she needs to take her blood pressure medications daily as prescribed and follow-up with primary care if she does not notice any improvement.  ER return precautions were discussed.  Pertinent labs & imaging results that were available during my care of the patient were reviewed by me and considered in my medical decision making (see chart for details). ____________________________________________   FINAL CLINICAL IMPRESSION(S) / ED DIAGNOSES  Final diagnoses:  Tympanic membrane rupture, right    ED Discharge Orders         Ordered    ofloxacin (FLOXIN) 0.3 % OTIC solution  2 times daily     12/08/19 1444    hydrocortisone 2.5 % ointment  2 times daily     12/08/19 1444          If controlled substance prescribed during this visit, 12 month history viewed on the [place] prior to issuing an initial prescription for Schedule II or III opiod.   Note:  This document was prepared using Dragon voice recognition software and may include unintentional dictation errors.     Victorino Dike, FNP 12/08/19 1729    Harvest Dark, MD 12/09/19 1156

## 2020-01-24 ENCOUNTER — Emergency Department
Admission: EM | Admit: 2020-01-24 | Discharge: 2020-01-24 | Disposition: A | Discharge status: Home / Self Care | Payer: BLUE CROSS/BLUE SHIELD | Attending: Emergency Medicine | Admitting: Emergency Medicine

## 2020-01-24 ENCOUNTER — Encounter: Payer: Self-pay | Admitting: Emergency Medicine

## 2020-01-24 ENCOUNTER — Other Ambulatory Visit: Payer: Self-pay

## 2020-01-24 DIAGNOSIS — H9201 Otalgia, right ear: Secondary | ICD-10-CM

## 2020-01-24 DIAGNOSIS — I10 Essential (primary) hypertension: Secondary | ICD-10-CM | POA: Insufficient documentation

## 2020-01-24 DIAGNOSIS — Z79899 Other long term (current) drug therapy: Secondary | ICD-10-CM | POA: Insufficient documentation

## 2020-01-24 DIAGNOSIS — F17210 Nicotine dependence, cigarettes, uncomplicated: Secondary | ICD-10-CM | POA: Insufficient documentation

## 2020-01-24 MED ORDER — PREDNISONE 10 MG PO TABS: ORAL_TABLET | ORAL | 0 refills | Status: DC

## 2020-01-24 MED ORDER — AMLODIPINE BESYLATE 10 MG PO TABS: 10.0000 mg | ORAL_TABLET | Freq: Every day | ORAL | 1 refills | Status: DC

## 2020-01-24 NOTE — ED Provider Notes (Signed)
[facility] Emergency Department Provider Note  ____________________________________________   First MD Initiated Contact with Patient 01/24/20 [phone]     (approximate)  I have reviewed the triage vital signs and the nursing notes.   HISTORY  Chief Complaint Otalgia   HPI Hailey Myers is a 54 y.o. female presents to the ED with complaint of right ear pain.  Patient states that she was seen for the same last month but was unable to follow-up with  ENT as she did not have any insurance.  Patient states that she has some decreased hearing in her right ear.  Patient was seen in the ED on 12/08/2019 and diagnosed with a perforated eardrum on the right without known history of injury or infection.  Patient states that she use the eardrops with improvement with the exception of her hearing.  Patient reports that this began again several days ago.  Patient denies any fever, chills, nausea or vomiting.  She denies any sneezing or coughing but does have some seasonal allergies and continues to smoke daily.  She also has an elevated blood pressure and states that she has been taking her blood pressure medication 1 tablet every 3 to 4 days to make it last as she does not have a primary care provider.  She denies any chest pain, dizziness, shortness of breath.  When asked about her headache she states that it is mostly because of her right ear pain.  She rates her ear pain is a 10/10.       Past Medical History:  Diagnosis Date  . Anemia   . Hypertension     There are no problems to display for this patient.   Past Surgical History:  Procedure Laterality Date  . TUBAL LIGATION      Prior to Admission medications   Medication Sig Start Date End Date Taking? Authorizing Provider  amLODipine (NORVASC) 10 MG tablet Take 1 tablet (10 mg total) by mouth daily. 01/24/20 01/23/21  Johnn Hai, PA-C  hydrocortisone 2.5 % ointment Apply topically 2 (two) times  daily. 12/08/19   Triplett, Johnette Abraham B, FNP  predniSONE (DELTASONE) 10 MG tablet Take 3 tablets once a day until finished 01/24/20   Johnn Hai, PA-C    Allergies Oxycodone, Paxil [paroxetine hcl], Penicillins, and Strawberry (diagnostic)  History reviewed. No pertinent family history.  Social History Social History   Tobacco Use  . Smoking status: Current Every Day Smoker    Types: Cigarettes  . Smokeless tobacco: Never Used  Substance Use Topics  . Alcohol use: Yes  . Drug use: Yes    Types: Marijuana    Comment: Occ    Review of Systems Constitutional: No fever/chills Eyes: No visual changes. ENT: No sore throat.  Positive right ear pain and decreased hearing right ear. Cardiovascular: Denies chest pain. Respiratory: Denies shortness of breath. Gastrointestinal:   No nausea, no vomiting.  Musculoskeletal: Negative for muscle aches. Skin: Negative for rash. Neurological: Positive for headaches, negative for focal weakness or numbness. ____________________________________________   PHYSICAL EXAM:  VITAL SIGNS: ED Triage Vitals  Enc Vitals Group     BP 01/24/20 0808 (!) 167/101     Pulse Rate 01/24/20 0808 74     Resp 01/24/20 0808 18     Temp 01/24/20 0808 98.9 F (37.2 C)     Temp Source 01/24/20 0808 Oral     SpO2 01/24/20 0808 96 %     Weight 01/24/20 0801 154 lb 15.7  oz (70.3 kg)     Height 01/24/20 0801 5\' 5"  (1.651 m)     Head Circumference --      Peak Flow --      Pain Score 01/24/20 0801 10     Pain Loc --      Pain Edu? --      Excl. in GC? --     Constitutional: Alert and oriented. Well appearing and in no acute distress. Eyes: Conjunctivae are normal.  Head: Atraumatic. Nose: No congestion/rhinnorhea.  TMs are dull bilaterally with poor light reflex but no injection or erythema is noted of the left or right.  No obvious perforation is noted of the right TM.  There is some dark material in the canal at approximately 6 o'clock position which  could be dried blood.  No active bleeding or exudate is seen. Mouth/Throat: Mucous membranes are moist.  Oropharynx non-erythematous. Neck: No stridor.   Hematological/Lymphatic/Immunilogical: No cervical lymphadenopathy. Cardiovascular: Normal rate, regular rhythm. Grossly normal heart sounds.  Good peripheral circulation. Respiratory: Normal respiratory effort.  No retractions. Lungs CTAB. Musculoskeletal: Moves upper and lower extremities with any difficulty normal gait was noted. Neurologic:  Normal speech and language. No gross focal neurologic deficits are appreciated. No gait instability. Skin:  Skin is warm, dry and intact. No rash noted. Psychiatric: Mood and affect are normal. Speech and behavior are normal.  ____________________________________________   LABS (all labs ordered are listed, but only abnormal results are displayed)  Labs Reviewed - No data to display   PROCEDURES  Procedure(s) performed (including Critical Care):  Procedures   ____________________________________________   INITIAL IMPRESSION / ASSESSMENT AND PLAN / ED COURSE  As part of my medical decision making, I reviewed the following data within the electronic MEDICAL RECORD NUMBER Notes from prior ED visits and [place] Controlled Substance Database  54 year old female presents to the ED with complaint of right ear pain.  She was seen in the emergency room in April 2021 for similar complaint.  At that time she was diagnosed with a perforated TM.  Patient reports that she did not follow-up with [facility] as she was instructed.  Patient reports that she did use her eardrops.  Patient also is taking her blood pressure medication 1 tablet every 4 to 5 days to make it last longer she does not have a PCP and generally comes to the ED for her prescription refills.  Patient was made aware that her blood pressure was elevated while in the ED.  She was strongly encouraged to follow-up with [facility] to reevaluate her  hearing loss in her right ear and ear problems.  A list of clinics including [facility] was given to her to obtain a primary care provider.  A refill of her Norvasc was written for her and she is encouraged to take this daily as well as discontinue smoking.  She is to return to the emergency department if any severe worsening of her symptoms or urgent concerns.  ____________________________________________   FINAL CLINICAL IMPRESSION(S) / ED DIAGNOSES  Final diagnoses:  Otalgia of right ear  Uncontrolled hypertension     ED Discharge Orders         Ordered    amLODipine (NORVASC) 10 MG tablet  Daily     Discontinue  Reprint     01/24/20 0839    predniSONE (DELTASONE) 10 MG tablet     Discontinue  Reprint     01/24/20 03/25/20  Note:  This document was prepared using Dragon voice recognition software and may include unintentional dictation errors.    Tommi Rumps, PA-C 01/24/20 6629    Dionne Bucy, MD 01/24/20 1459

## 2020-01-24 NOTE — ED Triage Notes (Signed)
Pt presnets to ED via POV with c/o R ear pain, states was seen last month for same, pt also c/o HA at this time. Ambulatory without difficulty at this time, A&O X4, NAD noted at this time.

## 2020-01-24 NOTE — Discharge Instructions (Signed)
Call make an appointment with [facility].  Dr. Jenne Campus is the specialist that is on-call.  He will need to evaluate your ear and also your loss of hearing.  Continue taking your blood pressure medication as your blood pressure was not controlled today in the ED with an initial blood pressure 167/101.  [facility].  The other clinics listed on your discharge papers are based on your income and their fees are on a sliding scale.  Take prednisone 3 tablets 1 time a day until completely finished.  Discontinue smoking as this may help control your blood pressure and decrease salt.

## 2020-02-25 ENCOUNTER — Telehealth: Payer: Self-pay | Admitting: General Practice

## 2020-02-25 NOTE — Telephone Encounter (Signed)
Individual has been contacted 3+ times regarding ED referral. No further attempts to contact individual will be made. 

## 2020-06-07 ENCOUNTER — Encounter: Payer: Self-pay | Admitting: Emergency Medicine

## 2020-06-07 ENCOUNTER — Emergency Department
Admission: EM | Admit: 2020-06-07 | Discharge: 2020-06-07 | Disposition: A | Discharge status: Home / Self Care | Payer: Self-pay | Attending: Student in an Organized Health Care Education/Training Program | Admitting: Student in an Organized Health Care Education/Training Program

## 2020-06-07 ENCOUNTER — Other Ambulatory Visit: Payer: Self-pay

## 2020-06-07 ENCOUNTER — Emergency Department: Payer: Self-pay

## 2020-06-07 DIAGNOSIS — Z79899 Other long term (current) drug therapy: Secondary | ICD-10-CM | POA: Insufficient documentation

## 2020-06-07 DIAGNOSIS — F17210 Nicotine dependence, cigarettes, uncomplicated: Secondary | ICD-10-CM | POA: Insufficient documentation

## 2020-06-07 DIAGNOSIS — I10 Essential (primary) hypertension: Secondary | ICD-10-CM | POA: Insufficient documentation

## 2020-06-07 DIAGNOSIS — J4521 Mild intermittent asthma with (acute) exacerbation: Secondary | ICD-10-CM | POA: Insufficient documentation

## 2020-06-07 MED ORDER — IPRATROPIUM-ALBUTEROL 0.5-2.5 (3) MG/3ML IN SOLN
3.0000 mL | Freq: Once | RESPIRATORY_TRACT | Status: AC
  Administered 2020-06-07: 3 mL via RESPIRATORY_TRACT
  Filled 2020-06-07: qty 3

## 2020-06-07 MED ORDER — METHYLPREDNISOLONE SODIUM SUCC 125 MG IJ SOLR
125.0000 mg | Freq: Once | INTRAMUSCULAR | Status: AC
  Administered 2020-06-07: 125 mg via INTRAMUSCULAR
  Filled 2020-06-07: qty 2

## 2020-06-07 NOTE — ED Notes (Signed)
Pt transported to xray 

## 2020-06-07 NOTE — ED Provider Notes (Signed)
[facility] Emergency Department Provider Note  ____________________________________________  Time seen: Approximately 3:41 PM  I have reviewed the triage vital signs and the nursing notes.   HISTORY  Chief Complaint Shortness of Breath    HPI Hailey Myers is a 54 y.o. female who presents the emergency department for work complaining of shortness of breath.  Patient states that she has asthma/reactive airway disease with certain fumes and chemicals.  She states that she works at the [employer], and depending on her line for the day, she can be exposed to fumes.  She states that today she was on a line with gas, oil and some other fumes.  She states that there are fluids to vent this area, however after a period of time she will start feeling short of breath, having wheezing and chest tightness.  She states that typically when she is not exposed to fumes she does not have any chronic shortness of breath problems.  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  She states that she went to her supervisor, asked to be moved to one of the other lines that does not involve fumes.  She states that the supervisor informed her that she would need to present to a healthcare provider, be evaluated and have a note saying that the fumes would trigger her asthma.  Patient states that she used her inhaler twice.  Initially she had audible wheezing, after using her inhaler she states that her chest tightness and shortness of breath has drastically improved.  She states that she is here primarily for a note so that she can be on a different work line and not be exposed to fumes with resulting asthma complications.         Past Medical History:  Diagnosis Date  . Anemia   . Hypertension     There are no problems to display for this patient.   Past Surgical History:  Procedure Laterality Date  . TUBAL LIGATION      Prior to  Admission medications   Medication Sig Start Date End Date Taking? Authorizing Provider  amLODipine (NORVASC) 10 MG tablet Take 1 tablet (10 mg total) by mouth daily. 01/24/20 01/23/21  Tommi Rumps, PA-C  hydrocortisone 2.5 % ointment Apply topically 2 (two) times daily. 12/08/19   Triplett, Rulon Eisenmenger B, FNP  predniSONE (DELTASONE) 10 MG tablet Take 3 tablets once a day until finished 01/24/20   Tommi Rumps, PA-C    Allergies Oxycodone, Paxil [paroxetine hcl], Penicillins, and Strawberry (diagnostic)  History reviewed. No pertinent family history.  Social History Social History   Tobacco Use  . Smoking status: Current Every Day Smoker    Types: Cigarettes  . Smokeless tobacco: Never Used  Substance Use Topics  . Alcohol use: Yes  . Drug use: Yes    Types: Marijuana    Comment: Occ     Review of Systems  Constitutional: No fever/chills Eyes: No visual changes. No discharge ENT: No upper respiratory complaints. Cardiovascular: no chest pain. Respiratory: Wheezing, chest tightness, shortness of breath after exposure to fumes Gastrointestinal: No abdominal pain.  No nausea, no vomiting.  No diarrhea.  No constipation. Musculoskeletal: Negative for musculoskeletal pain. Skin: Negative for rash, abrasions, lacerations, ecchymosis. Neurological: Negative for headaches, focal weakness or numbness.  10 System ROS otherwise negative.  ____________________________________________   PHYSICAL EXAM:  VITAL SIGNS: ED Triage Vitals  Enc Vitals Group  BP 06/07/20 1513 (!) 177/99     Pulse Rate 06/07/20 1513 77     Resp 06/07/20 1513 20     Temp 06/07/20 1513 98.8 F (37.1 C)     Temp Source 06/07/20 1513 Oral     SpO2 06/07/20 1513 99 %     Weight 06/07/20 1517 175 lb 14.8 oz (79.8 kg)     Height 06/07/20 1517 5\' 5"  (1.651 m)     Head Circumference --      Peak Flow --      Pain Score 06/07/20 1517 0     Pain Loc --      Pain Edu? --      Excl. in GC? --       Constitutional: Alert and oriented. Well appearing and in no acute distress. Eyes: Conjunctivae are normal. PERRL. EOMI. Head: Atraumatic. ENT:      Ears:       Nose: No congestion/rhinnorhea.      Mouth/Throat: Mucous membranes are moist.  Neck: No stridor.    Cardiovascular: Normal rate, regular rhythm. Normal S1 and S2.  Good peripheral circulation. Respiratory: Normal respiratory effort without tachypnea or retractions. Lungs with coarse breath sounds in the bilateral lower lung fields.  No frank wheezing.  No rales or rhonchi.  Good air entry to the bases with no decreased or absent breath sounds. Gastrointestinal: Bowel sounds 4 quadrants. Soft and nontender to palpation. No guarding or rigidity. No palpable masses. No distention. No CVA tenderness. Musculoskeletal: Full range of motion to all extremities. No gross deformities appreciated. Neurologic:  Normal speech and language. No gross focal neurologic deficits are appreciated.  Skin:  Skin is warm, dry and intact. No rash noted. Psychiatric: Mood and affect are normal. Speech and behavior are normal. Patient exhibits appropriate insight and judgement.   ____________________________________________   LABS (all labs ordered are listed, but only abnormal results are displayed)  Labs Reviewed - No data to display ____________________________________________  EKG  ED ECG REPORT I, 06/09/20 Dawon Troop,  personally viewed and interpreted this ECG.   Date: 06/07/2020  EKG Time: 1515 hrs.  Rate: 76 bpm  Rhythm: unchanged from previous tracings, normal sinus rhythm  Axis: Left axis deviation  Intervals:none  ST&T Change: No ST elevation or depression noted  Normal sinus rhythm.  No STEMI.  Inverted P waves in V1 consistent with left atrial enlargement.  Incomplete right bundle branch block.  No significant changes from previous EKG of March 2020.  ____________________________________________  RADIOLOGY I  personally viewed and evaluated these images as part of my medical decision making, as well as reviewing the written report by the radiologist.  ED Provider Interpretation: No acute cardiopulmonary abnormality identified.  DG Chest 2 View  Result Date: 06/07/2020 CLINICAL DATA:  Shortness of breath EXAM: CHEST - 2 VIEW COMPARISON:  10/03/2019 FINDINGS: Minimal atelectasis or scarring at the bases. No acute consolidation or effusion. Stable cardiomediastinal silhouette. No pneumothorax. IMPRESSION: Minimal atelectasis or scarring at the bases. Electronically Signed   By: 10/05/2019 M.D.   On: 06/07/2020 15:52    ____________________________________________    PROCEDURES  Procedure(s) performed:    Procedures    Medications  ipratropium-albuterol (DUONEB) 0.5-2.5 (3) MG/3ML nebulizer solution 3 mL (3 mLs Nebulization Given 06/07/20 1606)  methylPREDNISolone sodium succinate (SOLU-MEDROL) 125 mg/2 mL injection 125 mg (125 mg Intramuscular Given 06/07/20 1607)     ____________________________________________   INITIAL IMPRESSION / ASSESSMENT AND PLAN / ED COURSE  Pertinent labs &  imaging results that were available during my care of the patient were reviewed by me and considered in my medical decision making (see chart for details).  Review of the [place] CSRS was performed in accordance of the [facility] prior to dispensing any controlled drugs.           Patient's diagnosis is consistent with asthma exacerbation.  Patient presented to the emergency department with chest tightness, wheezing after being exposed to chemical vapors.  Patient states that this typically happens when she is exposed at work to certain parts of the line.  She states that she has talked with her supervisor and states that she needs a note saying that the vapors cause asthma symptoms.  Patient has taken her albuterol prior to arrival which improved symptoms.  Chest x-ray revealed no consolidation concerning for  pneumonia or findings consistent with bronchitis.  EKG is reassuring..  She still had some mild coarse breath sounds bilaterally and was given DuoNeb and Solu-Medrol here in the emergency department.  No new prescriptions at this time.  I will provide a note for the patient stating that certain chemical vapors can increase asthma symptoms.  Follow-up with primary care as needed..  Patient is given ED precautions to return to the ED for any worsening or new symptoms.     ____________________________________________  FINAL CLINICAL IMPRESSION(S) / ED DIAGNOSES  Final diagnoses:  Mild intermittent asthma with exacerbation      NEW MEDICATIONS STARTED DURING THIS VISIT:  ED Discharge Orders    None          This chart was dictated using voice recognition software/Dragon. Despite best efforts to proofread, errors can occur which can change the meaning. Any change was purely unintentional.    Racheal Patches, PA-C 06/07/20 1701    Willy Eddy, MD 06/07/20 1810

## 2020-06-07 NOTE — ED Triage Notes (Signed)
Pt presents to ED via POV, pt states hx of bronchitis. Pt states while at work is exposed to exhaust, gas, and oil. Pt states has requested to be taken off that line. Pt states has not been using the inhaler she was prescribed. Pt states coughing, SOB. Pt states she was sent to get a note for work.   Pt able to speak in full and complete sentences at this time. NAD noted at this time.

## 2020-06-07 NOTE — ED Triage Notes (Signed)
Pt denies current SOB, states only SOB while at work. This RN spoke with Dr. Larinda Buttery regarding orders, per Dr. Larinda Buttery, EKG and Chest X-ray only at this time.

## 2020-06-07 NOTE — ED Notes (Signed)
Pt request note to workplace be specific with the following information:  EXAMPLE: Cedar Roseman needs to be excused from work today Oct. 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  The pt has history of chronic bronchitis and should not be placed in work environments in or around gas, exhaust or paint fumes as this is not good for her breathing. The pt is unable to control chest pain and shortness of breathe.

## 2020-08-17 ENCOUNTER — Emergency Department
Admission: EM | Admit: 2020-08-17 | Discharge: 2020-08-17 | Disposition: A | Discharge status: Home / Self Care | Payer: Self-pay | Attending: Student in an Organized Health Care Education/Training Program | Admitting: Student in an Organized Health Care Education/Training Program

## 2020-08-17 ENCOUNTER — Other Ambulatory Visit: Payer: Self-pay

## 2020-08-17 ENCOUNTER — Encounter: Payer: Self-pay | Admitting: *Deleted

## 2020-08-17 ENCOUNTER — Emergency Department: Payer: Self-pay

## 2020-08-17 DIAGNOSIS — J209 Acute bronchitis, unspecified: Secondary | ICD-10-CM | POA: Insufficient documentation

## 2020-08-17 DIAGNOSIS — F17210 Nicotine dependence, cigarettes, uncomplicated: Secondary | ICD-10-CM | POA: Insufficient documentation

## 2020-08-17 DIAGNOSIS — Z79899 Other long term (current) drug therapy: Secondary | ICD-10-CM | POA: Insufficient documentation

## 2020-08-17 DIAGNOSIS — I10 Essential (primary) hypertension: Secondary | ICD-10-CM | POA: Insufficient documentation

## 2020-08-17 LAB — CBC
HCT: 40.3 % (ref 36.0–46.0)
Hemoglobin: 13.4 g/dL (ref 12.0–15.0)
MCH: 29.6 pg (ref 26.0–34.0)
MCHC: 33.3 g/dL (ref 30.0–36.0)
MCV: 89.2 fL (ref 80.0–100.0)
Platelets: 150 10*3/uL (ref 150–400)
RBC: 4.52 MIL/uL (ref 3.87–5.11)
RDW: 13.9 % (ref 11.5–15.5)
WBC: 5.8 10*3/uL (ref 4.0–10.5)
nRBC: 0 % (ref 0.0–0.2)

## 2020-08-17 LAB — BASIC METABOLIC PANEL
Anion gap: 8 (ref 5–15)
BUN: 13 mg/dL (ref 6–20)
CO2: 25 mmol/L (ref 22–32)
Calcium: 8.6 mg/dL — ABNORMAL LOW (ref 8.9–10.3)
Chloride: 106 mmol/L (ref 98–111)
Creatinine, Ser: 0.67 mg/dL (ref 0.44–1.00)
GFR, Estimated: >60 mL/min (ref >60)
Glucose, Bld: 127 mg/dL — ABNORMAL HIGH (ref 70–99)
Potassium: 3.4 mmol/L — ABNORMAL LOW (ref 3.5–5.1)
Sodium: 139 mmol/L (ref 135–145)

## 2020-08-17 LAB — TROPONIN I (HIGH SENSITIVITY): Troponin I (High Sensitivity): 9 ng/L (ref <18)

## 2020-08-17 MED ORDER — AMLODIPINE BESYLATE 5 MG PO TABS
5.0000 mg | ORAL_TABLET | Freq: Once | ORAL | Status: AC
  Administered 2020-08-17: 5 mg via ORAL
  Filled 2020-08-17: qty 1

## 2020-08-17 MED ORDER — PREDNISONE 20 MG PO TABS: 40.0000 mg | ORAL_TABLET | Freq: Every day | ORAL | 0 refills | Status: AC

## 2020-08-17 MED ORDER — PREDNISONE 20 MG PO TABS
60.0000 mg | ORAL_TABLET | Freq: Once | ORAL | Status: AC
  Administered 2020-08-17: 60 mg via ORAL
  Filled 2020-08-17: qty 3

## 2020-08-17 MED ORDER — IPRATROPIUM-ALBUTEROL 0.5-2.5 (3) MG/3ML IN SOLN
3.0000 mL | Freq: Once | RESPIRATORY_TRACT | Status: AC
  Administered 2020-08-17: 3 mL via RESPIRATORY_TRACT
  Filled 2020-08-17: qty 3

## 2020-08-17 MED ORDER — AMLODIPINE BESYLATE 10 MG PO TABS: 10.0000 mg | ORAL_TABLET | Freq: Every day | ORAL | 1 refills | Status: AC

## 2020-08-17 MED ORDER — ACETAMINOPHEN 500 MG PO TABS
1000.0000 mg | ORAL_TABLET | Freq: Once | ORAL | Status: AC
  Administered 2020-08-17: 1000 mg via ORAL
  Filled 2020-08-17: qty 2

## 2020-08-17 NOTE — ED Notes (Signed)
MD at bedside. 

## 2020-08-17 NOTE — ED Triage Notes (Signed)
Pt ambulatory to triage.  Pt has right side chest pain.  Pt reports a cough.  cig smoker.  Sx for 3 days.  No sob. Pt alert  Speech clear.

## 2020-08-17 NOTE — ED Provider Notes (Addendum)
[facility] Emergency Department Provider Note    Event Date/Time   First MD Initiated Contact with Patient 08/17/20 1910     (approximate)  I have reviewed the triage vital signs and the nursing notes.   HISTORY  Chief Complaint Chest Pain    HPI Hailey Myers is a 55 y.o. female below his past medical history as well as previous diagnosis of bronchitis is a daily smoker presents to the ER for cough congestion headache for the past 3 to 4 days.  States that she did have Covid contact roughly 1 to 2 weeks ago but they have since been negative and she is been on quarantine.  No measured fevers.  Feels like she is having episode of bronchitis and is also worried about her blood pressure being elevated as she is not been able to get her high blood pressure prescription filled for about a month.  She says she is also been having some chest discomfort with the cough for the past 3 days.  Denies any pain with deep inspiration.  No lower extremity swelling.    Past Medical History:  Diagnosis Date  . Anemia   . Hypertension    No family history on file. Past Surgical History:  Procedure Laterality Date  . TUBAL LIGATION     There are no problems to display for this patient.     Prior to Admission medications   Medication Sig Start Date End Date Taking? Authorizing Provider  predniSONE (DELTASONE) 20 MG tablet Take 2 tablets (40 mg total) by mouth daily for 5 days. 08/17/20 08/22/20 Yes Willy Eddy, MD  amLODipine (NORVASC) 10 MG tablet Take 1 tablet (10 mg total) by mouth daily. 08/17/20 08/17/21  Willy Eddy, MD  hydrocortisone 2.5 % ointment Apply topically 2 (two) times daily. 12/08/19   Chinita Pester, FNP    Allergies Oxycodone, Paxil [paroxetine hcl], Penicillins, and Strawberry (diagnostic)    Social History Social History   Tobacco Use  . Smoking status: Current Every Day Smoker    Types: Cigarettes  . Smokeless tobacco: Never  Used  Substance Use Topics  . Alcohol use: Yes  . Drug use: Yes    Types: Marijuana    Comment: Occ    Review of Systems Patient denies headaches, rhinorrhea, blurry vision, numbness, shortness of breath, chest pain, edema, cough, abdominal pain, nausea, vomiting, diarrhea, dysuria, fevers, rashes or hallucinations unless otherwise stated above in HPI. ____________________________________________   PHYSICAL EXAM:  VITAL SIGNS: Vitals:   08/17/20 1658 08/17/20 1941  BP: (!) 174/101 (!) 149/96  Pulse: 90 87  Resp: 17   Temp: 99.4 F (37.4 C)   SpO2: 96% 96%    Constitutional: Alert and oriented.  Eyes: Conjunctivae are normal.  Head: Atraumatic. Nose: No congestion/rhinnorhea. Mouth/Throat: Mucous membranes are moist.   Neck: No stridor. Painless ROM.  Cardiovascular: Normal rate, regular rhythm. Grossly normal heart sounds.  Good peripheral circulation. Respiratory: Normal respiratory effort.  No retractions. Lungs coarse bibasilar breath sounds Gastrointestinal: Soft and nontender. No distention. No abdominal bruits. No CVA tenderness. Genitourinary:  Musculoskeletal: No lower extremity tenderness nor edema.  No joint effusions. Neurologic:  Normal speech and language. No gross focal neurologic deficits are appreciated. No facial droop Skin:  Skin is warm, dry and intact. No rash noted. Psychiatric: Mood and affect are normal. Speech and behavior are normal.  ____________________________________________   LABS (all labs ordered are listed, but only abnormal results are displayed)  Results for  orders placed or performed during the hospital encounter of 08/17/20 (from the past 24 hour(s))  Basic metabolic panel     Status: Abnormal   Collection Time: 08/17/20  5:01 PM  Result Value Ref Range   Sodium 139 135 - 145 mmol/L   Potassium 3.4 (L) 3.5 - 5.1 mmol/L   Chloride 106 98 - 111 mmol/L   CO2 25 22 - 32 mmol/L   Glucose, Bld 127 (H) 70 - 99 mg/dL   BUN 13 6 - 20  mg/dL   Creatinine, Ser 9.60 0.44 - 1.00 mg/dL   Calcium 8.6 (L) 8.9 - 10.3 mg/dL   GFR, Estimated >45 >40 mL/min   Anion gap 8 5 - 15  CBC     Status: None   Collection Time: 08/17/20  5:01 PM  Result Value Ref Range   WBC 5.8 4.0 - 10.5 K/uL   RBC 4.52 3.87 - 5.11 MIL/uL   Hemoglobin 13.4 12.0 - 15.0 g/dL   HCT 98.1 19.1 - 47.8 %   MCV 89.2 80.0 - 100.0 fL   MCH 29.6 26.0 - 34.0 pg   MCHC 33.3 30.0 - 36.0 g/dL   RDW 29.5 62.1 - 30.8 %   Platelets 150 150 - 400 K/uL   nRBC 0.0 0.0 - 0.2 %  Troponin I (High Sensitivity)     Status: None   Collection Time: 08/17/20  5:01 PM  Result Value Ref Range   Troponin I (High Sensitivity) 9 <18 ng/L   ____________________________________________  EKG My review and personal interpretation at Time:   16:57 Indication: sob  Rate: 90  Rhythm: sinus Axis: normal Other: no stemi or depressions,  ____________________________________________  RADIOLOGY  I personally reviewed all radiographic images ordered to evaluate for the above acute complaints and reviewed radiology reports and findings.  These findings were personally discussed with the patient.  Please see medical record for radiology report.  ____________________________________________   PROCEDURES  Procedure(s) performed:  Procedures    Critical Care performed: no ____________________________________________   INITIAL IMPRESSION / ASSESSMENT AND PLAN / ED COURSE  Pertinent labs & imaging results that were available during my care of the patient were reviewed by me and considered in my medical decision making (see chart for details).   DDX: covid 19, pneumonia, CHF, ACS, PE, tension headache, SAH, cluster, migraine  Hailey Myers is a 55 y.o. who presents to the ED with presentation as described above.  Patient pleasant and well-appearing.  Does not appear to be having symptoms of CHF.  Doubt ACS given 3 days of discomfort that is primarily cough related with nonischemic  EKG and negative troponin.  This not consistent with PE.  States her headache is mild and consistent with previous headaches that she has on her blood pressure is elevated.  Does not appear c/w meningitis, encephalitis or sah.  Neuro intact.  Will order Norvasc.  Her exam is consistent with mild bronchitis.  I did recommend COVID-19 testing given recent contact patient is declining this.  Her chest x-ray without evidence of infiltrates.  Does not seem consistent with pneumonia.  Appears appropriate for outpatient follow-up     The patient was evaluated in Emergency Department today for the symptoms described in the history of present illness. He/she was evaluated in the context of the global COVID-19 pandemic, which necessitated consideration that the patient might be at risk for infection with the SARS-CoV-2 virus that causes COVID-19. Institutional protocols and algorithms that pertain to the evaluation of  patients at risk for COVID-19 are in a state of rapid change based on information released by regulatory bodies including the CDC and federal and state organizations. These policies and algorithms were followed during the patient's care in the ED.  As part of my medical decision making, I reviewed the following data within the [place] notes reviewed and incorporated, Labs reviewed, notes from prior ED visits and [place] Controlled Substance Database   ____________________________________________   FINAL CLINICAL IMPRESSION(S) / ED DIAGNOSES  Final diagnoses:  Acute bronchitis, unspecified organism  Hypertension, unspecified type      NEW MEDICATIONS STARTED DURING THIS VISIT:  New Prescriptions   PREDNISONE (DELTASONE) 20 MG TABLET    Take 2 tablets (40 mg total) by mouth daily for 5 days.     Note:  This document was prepared using Dragon voice recognition software and may include unintentional dictation errors.    Merlyn Lot, MD 08/17/20 Sharilyn Sites     Merlyn Lot, MD 08/17/20 [phone]

## 2020-08-24 ENCOUNTER — Emergency Department
Admission: EM | Admit: 2020-08-24 | Discharge: 2020-08-24 | Disposition: A | Discharge status: Home / Self Care | Payer: Self-pay | Attending: Emergency Medicine | Admitting: Emergency Medicine

## 2020-08-24 ENCOUNTER — Other Ambulatory Visit: Payer: Self-pay

## 2020-08-24 DIAGNOSIS — K649 Unspecified hemorrhoids: Secondary | ICD-10-CM

## 2020-08-24 DIAGNOSIS — Z79899 Other long term (current) drug therapy: Secondary | ICD-10-CM | POA: Insufficient documentation

## 2020-08-24 DIAGNOSIS — R10A0 Flank pain, unspecified side: Secondary | ICD-10-CM

## 2020-08-24 DIAGNOSIS — I10 Essential (primary) hypertension: Secondary | ICD-10-CM | POA: Insufficient documentation

## 2020-08-24 DIAGNOSIS — R109 Unspecified abdominal pain: Secondary | ICD-10-CM | POA: Insufficient documentation

## 2020-08-24 DIAGNOSIS — K5900 Constipation, unspecified: Secondary | ICD-10-CM

## 2020-08-24 DIAGNOSIS — F17210 Nicotine dependence, cigarettes, uncomplicated: Secondary | ICD-10-CM | POA: Insufficient documentation

## 2020-08-24 DIAGNOSIS — M5459 Other low back pain: Secondary | ICD-10-CM | POA: Insufficient documentation

## 2020-08-24 LAB — COMPREHENSIVE METABOLIC PANEL
ALT: 23 U/L (ref 0–44)
AST: 17 U/L (ref 15–41)
Albumin: 3.8 g/dL (ref 3.5–5.0)
Alkaline Phosphatase: 60 U/L (ref 38–126)
Anion gap: 11 (ref 5–15)
BUN: 18 mg/dL (ref 6–20)
CO2: 26 mmol/L (ref 22–32)
Calcium: 9.3 mg/dL (ref 8.9–10.3)
Chloride: 103 mmol/L (ref 98–111)
Creatinine, Ser: 0.83 mg/dL (ref 0.44–1.00)
GFR, Estimated: >60 mL/min (ref >60)
Glucose, Bld: 103 mg/dL — ABNORMAL HIGH (ref 70–99)
Potassium: 3.8 mmol/L (ref 3.5–5.1)
Sodium: 140 mmol/L (ref 135–145)
Total Bilirubin: 0.5 mg/dL (ref 0.3–1.2)
Total Protein: 7.3 g/dL (ref 6.5–8.1)

## 2020-08-24 LAB — URINALYSIS, COMPLETE (UACMP) WITH MICROSCOPIC
Bilirubin Urine: NEGATIVE
Glucose, UA: NEGATIVE mg/dL
Hgb urine dipstick: NEGATIVE
Ketones, ur: NEGATIVE mg/dL
Leukocytes,Ua: NEGATIVE
Nitrite: NEGATIVE
Protein, ur: NEGATIVE mg/dL
Specific Gravity, Urine: 1.024 (ref 1.005–1.030)
Squamous Epithelial / HPF: NONE SEEN (ref 0–5)
pH: 6 (ref 5.0–8.0)

## 2020-08-24 LAB — CBC WITH DIFFERENTIAL/PLATELET
Abs Immature Granulocytes: 0.04 10*3/uL (ref 0.00–0.07)
Basophils Absolute: 0 10*3/uL (ref 0.0–0.1)
Basophils Relative: 1 %
Eosinophils Absolute: 0.1 10*3/uL (ref 0.0–0.5)
Eosinophils Relative: 1 %
HCT: 41.1 % (ref 36.0–46.0)
Hemoglobin: 13.4 g/dL (ref 12.0–15.0)
Immature Granulocytes: 1 %
Lymphocytes Relative: 33 %
Lymphs Abs: 2.2 10*3/uL (ref 0.7–4.0)
MCH: 28.9 pg (ref 26.0–34.0)
MCHC: 32.6 g/dL (ref 30.0–36.0)
MCV: 88.6 fL (ref 80.0–100.0)
Monocytes Absolute: 0.4 10*3/uL (ref 0.1–1.0)
Monocytes Relative: 6 %
Neutro Abs: 3.9 10*3/uL (ref 1.7–7.7)
Neutrophils Relative %: 58 %
Platelets: 279 10*3/uL (ref 150–400)
RBC: 4.64 MIL/uL (ref 3.87–5.11)
RDW: 14 % (ref 11.5–15.5)
WBC: 6.7 10*3/uL (ref 4.0–10.5)
nRBC: 0 % (ref 0.0–0.2)

## 2020-08-24 NOTE — ED Provider Notes (Signed)
[facility] Emergency Department Provider Note  ____________________________________________   Event Date/Time   First MD Initiated Contact with Patient 08/24/20 [phone]     (approximate)  I have reviewed the triage vital signs and the nursing notes.   HISTORY  Chief Complaint Back Pain and Hemorrhoids   HPI Hailey Myers is a 55 y.o. female with a past medical history of HTN, anemia, tobacco abuse and recurrent bronchitis who presents for assessment of her currently to 3 days of some right-sided flank pain and right lower back pain associated with very hard stools of self diagnosis of hemorrhoids as well as some spotting until paper last couple days when patient has been straining particular hard.  She denies any urinary symptoms, nausea, vomiting, chest pain, cough, shortness of breath, left-sided neck pain mid back pain, headache, earache, sore throat rash or extremity pain.  No recent falls or injuries.  She denies NSAID use, EtOH use or illicit drug use. She denies any incontinence or perineal or lower extremity numbness.         Past Medical History:  Diagnosis Date  . Anemia   . Hypertension     There are no problems to display for this patient.   Past Surgical History:  Procedure Laterality Date  . TUBAL LIGATION      Prior to Admission medications   Medication Sig Start Date End Date Taking? Authorizing Provider  amLODipine (NORVASC) 10 MG tablet Take 1 tablet (10 mg total) by mouth daily. 08/17/20 08/17/21  Willy Eddy, MD  hydrocortisone 2.5 % ointment Apply topically 2 (two) times daily. 12/08/19   Chinita Pester, FNP    Allergies Oxycodone, Paxil [paroxetine hcl], Penicillins, and Strawberry (diagnostic)  No family history on file.  Social History Social History   Tobacco Use  . Smoking status: Current Every Day Smoker    Types: Cigarettes  . Smokeless tobacco: Never Used  Substance Use Topics  . Alcohol use: Yes  .  Drug use: Yes    Types: Marijuana    Comment: Occ    Review of Systems  Review of Systems  Constitutional: Negative for chills and fever.  HENT: Negative for sore throat.   Eyes: Negative for pain.  Respiratory: Negative for cough and stridor.   Cardiovascular: Negative for chest pain.  Gastrointestinal: Positive for abdominal pain, blood in stool and constipation. Negative for vomiting.  Genitourinary: Positive for flank pain.  Skin: Negative for rash.  Neurological: Negative for seizures, loss of consciousness and headaches.  Psychiatric/Behavioral: Negative for suicidal ideas.  All other systems reviewed and are negative.     ____________________________________________   PHYSICAL EXAM:  VITAL SIGNS: ED Triage Vitals [08/24/20 0832]  Enc Vitals Group     BP (!) 148/100     Pulse Rate 81     Resp 18     Temp 98.7 F (37.1 C)     Temp Source Oral     SpO2 99 %     Weight 170 lb (77.1 kg)     Height 5\' 6"  (1.676 m)     Head Circumference      Peak Flow      Pain Score 10     Pain Loc      Pain Edu?      Excl. in GC?    Vitals:   08/24/20 0832 08/24/20 1133  BP: (!) 148/100 (!) 137/93  Pulse: 81 70  Resp: 18   Temp: 98.7 F (37.1 C)  SpO2: 99% 97%   Physical Exam Vitals and nursing note reviewed. Exam conducted with a chaperone present.  Constitutional:      General: She is not in acute distress.    Appearance: She is well-developed and well-nourished.  HENT:     Head: Normocephalic and atraumatic.     Right Ear: External ear normal.     Left Ear: External ear normal.  Eyes:     Conjunctiva/sclera: Conjunctivae normal.  Cardiovascular:     Rate and Rhythm: Normal rate and regular rhythm.     Heart sounds: No murmur heard.   Pulmonary:     Effort: Pulmonary effort is normal. No respiratory distress.     Breath sounds: Normal breath sounds.  Abdominal:     Palpations: Abdomen is soft.     Tenderness: There is no abdominal tenderness.   Genitourinary:    Rectum: External hemorrhoid present. No anal fissure.     Comments: No active bleeding.  Musculoskeletal:        General: No edema.     Cervical back: Neck supple.  Skin:    General: Skin is warm and dry.  Neurological:     Mental Status: She is alert.  Psychiatric:        Mood and Affect: Mood and affect normal.     No tenderness step-offs or deformities over the T or L-spine. No overlying skin changes over the patient's back or point tenderness over patient's lower back. Patient has full strength and sensation to bilateral lower extremities. ____________________________________________   LABS (all labs ordered are listed, but only abnormal results are displayed)  Labs Reviewed  COMPREHENSIVE METABOLIC PANEL - Abnormal; Notable for the following components:      Result Value   Glucose, Bld 103 (*)    All other components within normal limits  URINALYSIS, COMPLETE (UACMP) WITH MICROSCOPIC - Abnormal; Notable for the following components:   Color, Urine YELLOW (*)    APPearance CLEAR (*)    Bacteria, UA RARE (*)    All other components within normal limits  CBC WITH DIFFERENTIAL/PLATELET   ____________________________________________  ____________________________________________   PROCEDURES  Procedure(s) performed (including Critical Care):  Procedures   ____________________________________________   INITIAL IMPRESSION / ASSESSMENT AND PLAN / ED COURSE      Patient presents with above stage III exam for assessment of some right-sided flank discomfort in the setting of some constipation passing very hard small stools and developing some hemorrhoids that were self diagnosed by patient which were also bleeding a little bit yesterday.  Patient is afebrile and hemodynamically stable on arrival.  Her abdomen is soft nontender throughout and she has no CVA tenderness.  She does have evidence of multiple small hemorrhoids which none are actively  bleeding from.  CBC obtained is unremarkable.  CMP shows no evidence of cholestasis, hepatitis or other significant actually metabolic derangement.  UA shows no evidence of infection or blood.  Overall patient's history, exam, and ED work-up is most consistent with likely symptomatic hemorrhoids related to hard stools and some constipation.  Low suspicion for kidney stone at this time given absence of blood in urine or elevated white blood cell count.  No signs of infection or CVA tenderness or fever or elevated white blood cell count to suggest pyelonephritis.  There is tenderness focally in the right lower quadrant elevated white blood cell count fever or other exam findings suggest appendicitis.  Patient denies any GU Evalose patient for PID or torsion at this  time.  Given evidence of hemorrhoids on exam suspect this is likely the cause or contributing significantly to patient's symptoms.  Advised sitz bath and addition of MiraLAX to soften the stools.  Patient discharged in stable condition.  Strict return cautions advised and discussed.  Advise close follow-up with PCP in 5 to 7 days and return immediately to the emergency room should she experience any new or acute worsening of symptoms.  ____________________________________________   FINAL CLINICAL IMPRESSION(S) / ED DIAGNOSES  Final diagnoses:  Hemorrhoids, unspecified hemorrhoid type  Flank pain  Constipation, unspecified constipation type    Medications - No data to display   ED Discharge Orders    None       Note:  This document was prepared using Dragon voice recognition software and may include unintentional dictation errors.   Gilles Chiquito, MD 08/24/20 1146

## 2020-08-24 NOTE — ED Triage Notes (Signed)
First Nurse Note:  Arrives c/o hemorrhoid pain' also c/o right back and side pain.    Patient is AAOx3.  Skin warm and dry. NAD

## 2020-08-24 NOTE — ED Triage Notes (Signed)
Pt comes via POV from home with c/o back pain and hemorrhoid. Pt states this started Saturday, pt states pain is worse when standing.

## 2022-11-26 IMAGING — CR DG CHEST 2V
1 series · 2 of 2 positions shown · non-contrast
Comparison: Chest radiograph dated 06/07/2020.

CLINICAL DATA: 54-year-old female with chest pain.

EXAM:
CHEST - 2 VIEW

[Series 1: dg chest 2 view · 0.14mm/px · 2 of 2 slices shown]
[im 1/2]
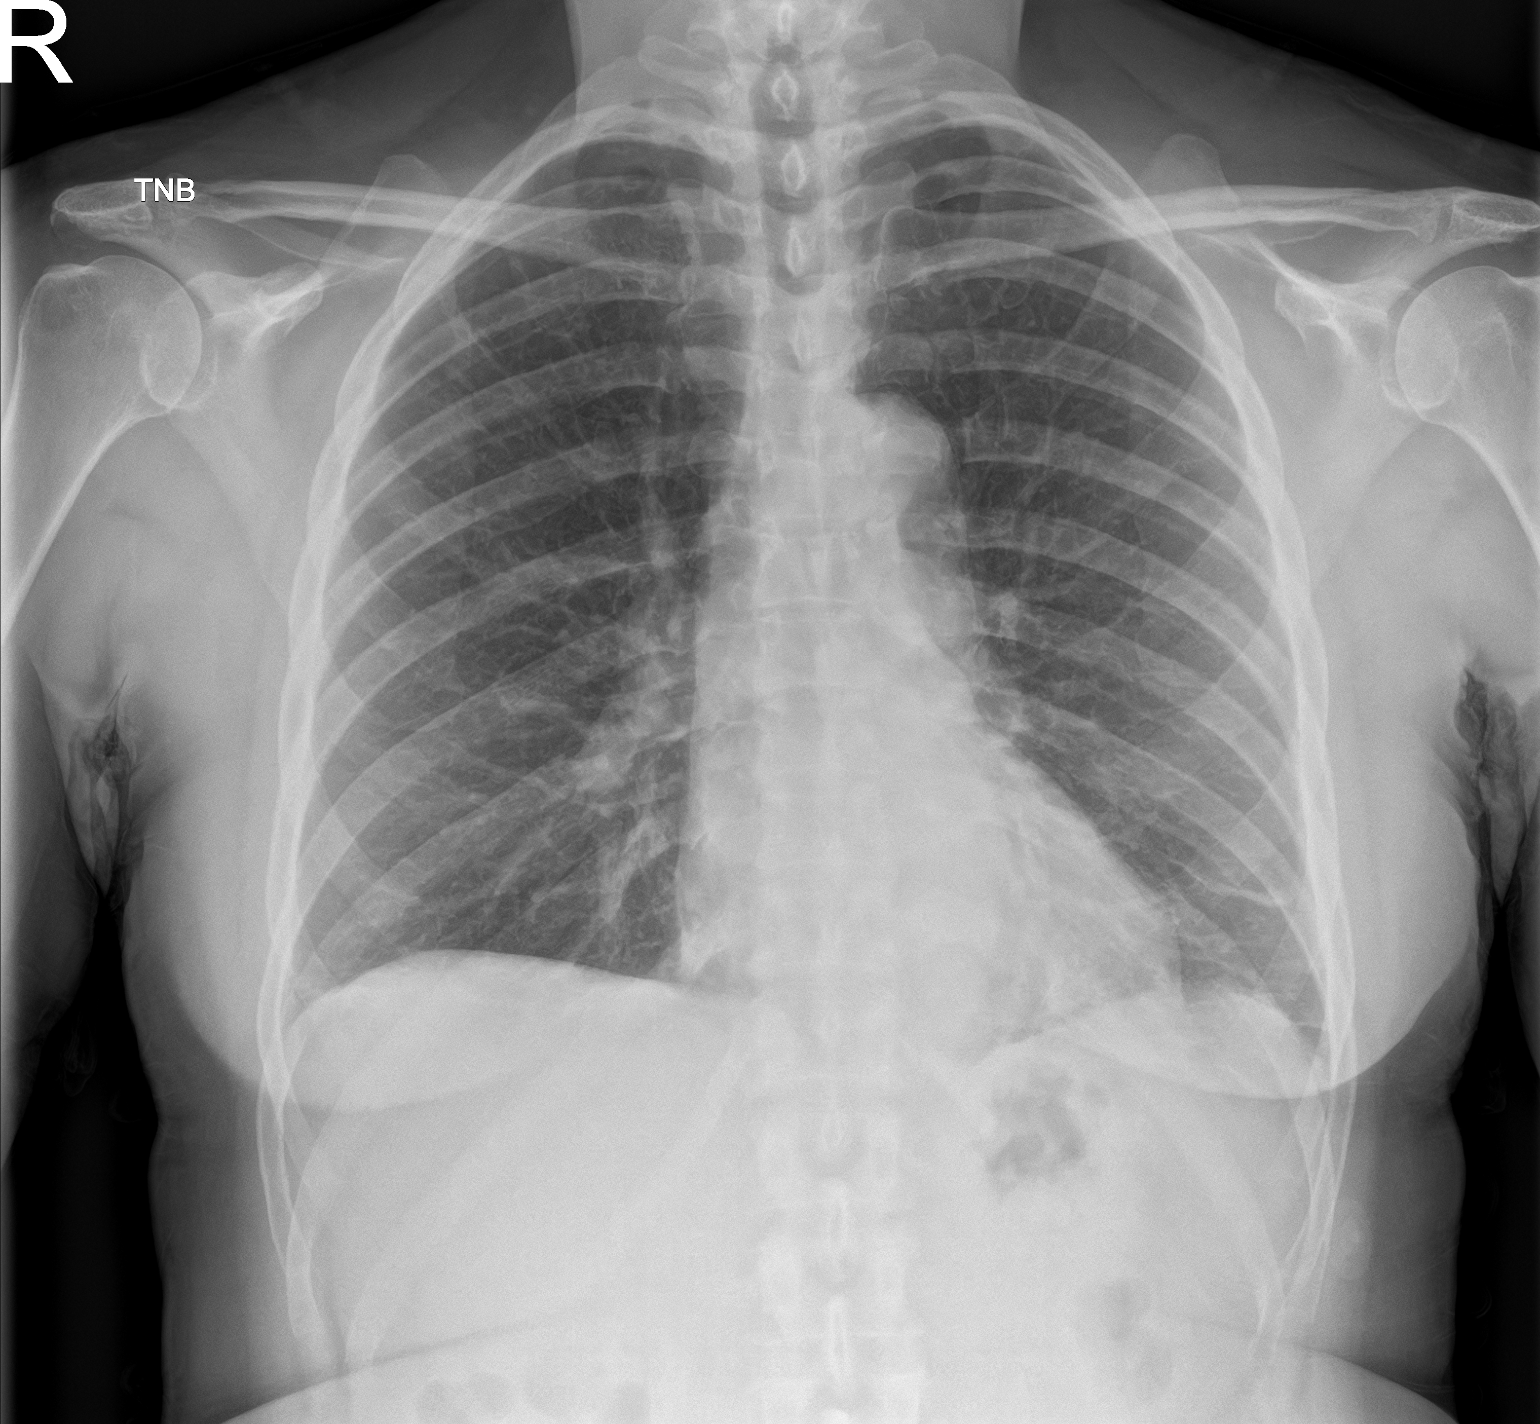
[im 2/2]
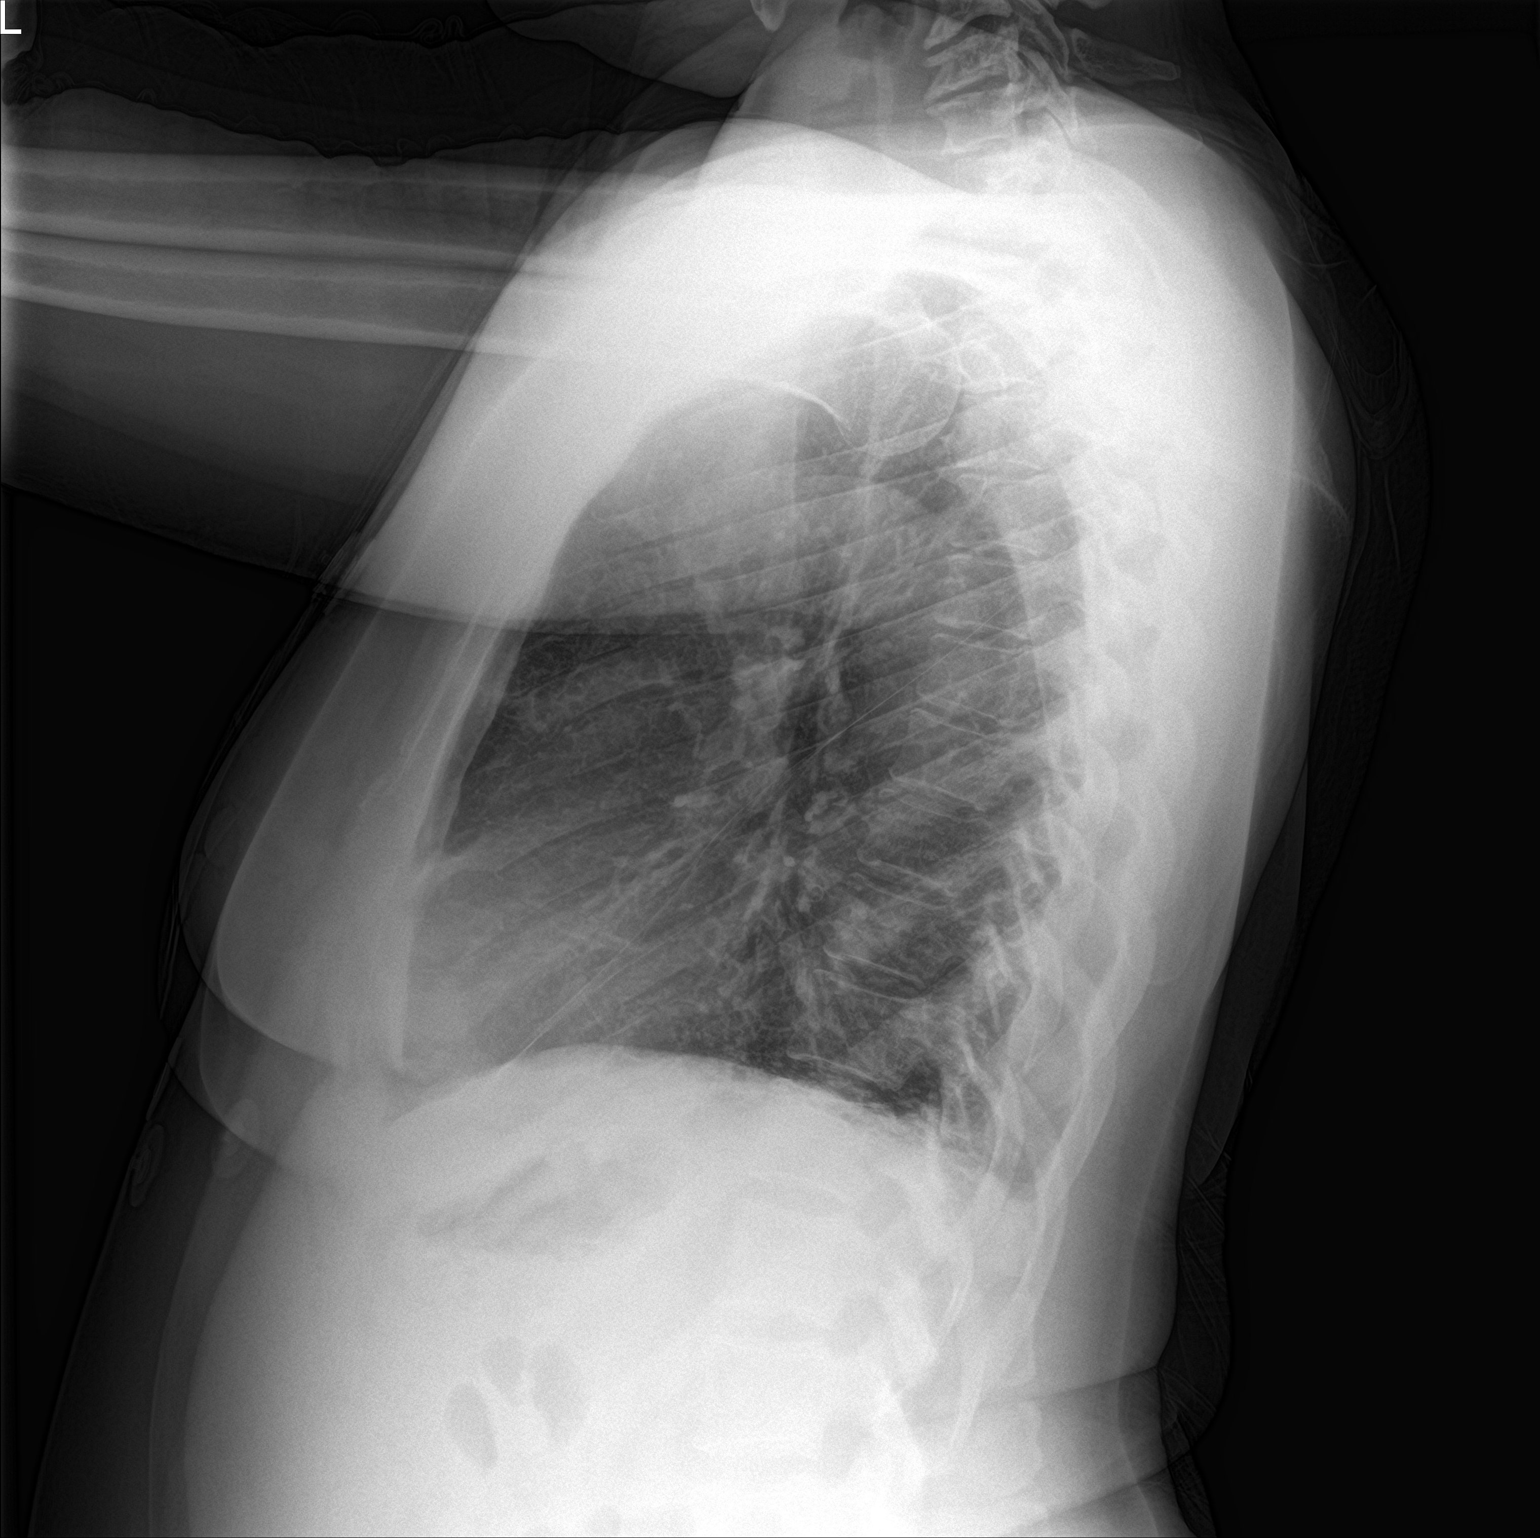

[2 of 2 positions shown; findings below may reference images not displayed]

FINDINGS: Left lung base linear and platelike atelectasis. Developing atypical
infection is less likely but not excluded. No focal consolidation,
pleural effusion, or pneumothorax. The cardiac silhouette is within
limits. No acute osseous pathology.
IMPRESSION: No focal consolidation.
